# Patient Record
Sex: Female | Born: 1955 | Race: White | Hispanic: No | Smoking: Never smoker
Health system: Southern US, Community
[De-identification: ages and names within clinical notes are randomized; demographics above are authoritative.]

## PROBLEM LIST (undated history)

## (undated) DIAGNOSIS — K219 Gastro-esophageal reflux disease without esophagitis: Secondary | ICD-10-CM

## (undated) DIAGNOSIS — T7840XA Allergy, unspecified, initial encounter: Secondary | ICD-10-CM

## (undated) DIAGNOSIS — I10 Essential (primary) hypertension: Secondary | ICD-10-CM

## (undated) DIAGNOSIS — F909 Attention-deficit hyperactivity disorder, unspecified type: Secondary | ICD-10-CM

## (undated) HISTORY — PX: KNEE SURGERY: SHX244

## (undated) HISTORY — DX: Essential (primary) hypertension: I10

## (undated) HISTORY — DX: Allergy, unspecified, initial encounter: T78.40XA

## (undated) HISTORY — DX: Gastro-esophageal reflux disease without esophagitis: K21.9

## (undated) HISTORY — PX: CARPAL TUNNEL RELEASE: SHX101

## (undated) HISTORY — PX: ABDOMINAL HYSTERECTOMY: SHX81

## (undated) HISTORY — PX: PLANTAR FASCIA SURGERY: SHX746

---

## 2003-02-17 ENCOUNTER — Encounter: Payer: Self-pay | Admitting: Family Medicine

## 2005-12-20 ENCOUNTER — Ambulatory Visit: Payer: Self-pay | Admitting: Family Medicine

## 2006-02-26 ENCOUNTER — Ambulatory Visit: Payer: Self-pay | Admitting: General Practice

## 2006-07-09 ENCOUNTER — Emergency Department: Payer: Self-pay | Admitting: Emergency Medicine

## 2007-06-03 ENCOUNTER — Encounter (INDEPENDENT_AMBULATORY_CARE_PROVIDER_SITE_OTHER): Payer: Self-pay | Admitting: Obstetrics and Gynecology

## 2007-06-03 ENCOUNTER — Ambulatory Visit (HOSPITAL_COMMUNITY): Admission: RE | Admit: 2007-06-03 | Discharge: 2007-06-04 | Payer: Self-pay | Admitting: Obstetrics and Gynecology

## 2008-03-24 ENCOUNTER — Encounter: Payer: Self-pay | Admitting: Family Medicine

## 2008-06-05 ENCOUNTER — Ambulatory Visit: Payer: Self-pay

## 2008-07-10 ENCOUNTER — Emergency Department: Payer: Self-pay | Admitting: Emergency Medicine

## 2008-11-15 ENCOUNTER — Ambulatory Visit (HOSPITAL_BASED_OUTPATIENT_CLINIC_OR_DEPARTMENT_OTHER): Admission: RE | Admit: 2008-11-15 | Discharge: 2008-11-15 | Payer: Self-pay | Admitting: Orthopedic Surgery

## 2008-12-06 ENCOUNTER — Ambulatory Visit: Payer: Self-pay

## 2009-02-23 ENCOUNTER — Encounter: Payer: Self-pay | Admitting: Family Medicine

## 2009-06-04 ENCOUNTER — Emergency Department: Payer: Self-pay | Admitting: Emergency Medicine

## 2009-08-04 LAB — CONVERTED CEMR LAB

## 2009-11-08 ENCOUNTER — Ambulatory Visit: Payer: Self-pay | Admitting: Family Medicine

## 2009-11-08 ENCOUNTER — Encounter (INDEPENDENT_AMBULATORY_CARE_PROVIDER_SITE_OTHER): Payer: Self-pay | Admitting: *Deleted

## 2009-11-08 DIAGNOSIS — I10 Essential (primary) hypertension: Secondary | ICD-10-CM

## 2009-11-08 DIAGNOSIS — K219 Gastro-esophageal reflux disease without esophagitis: Secondary | ICD-10-CM | POA: Insufficient documentation

## 2009-12-01 ENCOUNTER — Ambulatory Visit: Payer: Self-pay | Admitting: Family Medicine

## 2009-12-28 ENCOUNTER — Ambulatory Visit: Payer: Self-pay | Admitting: Family Medicine

## 2009-12-30 ENCOUNTER — Telehealth: Payer: Self-pay | Admitting: Family Medicine

## 2010-01-13 ENCOUNTER — Ambulatory Visit: Payer: Self-pay | Admitting: Family Medicine

## 2010-02-15 ENCOUNTER — Telehealth: Payer: Self-pay | Admitting: Family Medicine

## 2010-03-10 ENCOUNTER — Ambulatory Visit: Payer: Self-pay | Admitting: Family Medicine

## 2010-05-18 ENCOUNTER — Telehealth: Payer: Self-pay | Admitting: Family Medicine

## 2010-06-06 ENCOUNTER — Ambulatory Visit: Payer: Self-pay | Admitting: Family Medicine

## 2010-06-06 DIAGNOSIS — L259 Unspecified contact dermatitis, unspecified cause: Secondary | ICD-10-CM | POA: Insufficient documentation

## 2010-06-13 ENCOUNTER — Ambulatory Visit: Payer: Self-pay | Admitting: Internal Medicine

## 2010-06-29 ENCOUNTER — Ambulatory Visit: Payer: Self-pay | Admitting: Family Medicine

## 2010-06-29 DIAGNOSIS — H60339 Swimmer's ear, unspecified ear: Secondary | ICD-10-CM | POA: Insufficient documentation

## 2010-07-19 ENCOUNTER — Ambulatory Visit: Payer: Self-pay | Admitting: Family Medicine

## 2010-07-19 DIAGNOSIS — M7710 Lateral epicondylitis, unspecified elbow: Secondary | ICD-10-CM | POA: Insufficient documentation

## 2010-07-20 ENCOUNTER — Telehealth: Payer: Self-pay | Admitting: Family Medicine

## 2010-07-21 ENCOUNTER — Encounter: Payer: Self-pay | Admitting: Family Medicine

## 2010-08-14 ENCOUNTER — Telehealth: Payer: Self-pay | Admitting: Family Medicine

## 2010-11-23 ENCOUNTER — Ambulatory Visit: Payer: Self-pay | Admitting: Family Medicine

## 2010-11-28 ENCOUNTER — Ambulatory Visit
Admission: RE | Admit: 2010-11-28 | Discharge: 2010-11-28 | Payer: Self-pay | Source: Home / Self Care | Attending: Internal Medicine | Admitting: Internal Medicine

## 2010-12-01 ENCOUNTER — Telehealth: Payer: Self-pay | Admitting: Family Medicine

## 2010-12-12 ENCOUNTER — Encounter: Payer: Self-pay | Admitting: Family Medicine

## 2010-12-12 ENCOUNTER — Ambulatory Visit
Admission: RE | Admit: 2010-12-12 | Discharge: 2010-12-12 | Payer: Self-pay | Source: Home / Self Care | Attending: Psychology | Admitting: Psychology

## 2010-12-14 ENCOUNTER — Ambulatory Visit
Admission: RE | Admit: 2010-12-14 | Discharge: 2010-12-14 | Payer: Self-pay | Source: Home / Self Care | Attending: Psychology | Admitting: Psychology

## 2010-12-27 ENCOUNTER — Ambulatory Visit: Payer: Self-pay | Admitting: Psychology

## 2010-12-27 NOTE — Assessment & Plan Note (Signed)
Summary: LEFT ELBOW PAIN   Vital Signs:  Patient profile:   55 year old female Height:      60 inches Weight:      204.50 pounds BMI:     40.08 Temp:     98.3 degrees F oral Pulse rate:   72 / minute Pulse rhythm:   regular BP sitting:   120 / 82  (right arm) Cuff size:   large  Vitals Entered By: Linde Gillis CMA [facility] Dull) (July 19, 2010 8:23 AM) CC: left elbow pain   History of Present Illness: 55 yo with 1 week of left elbow pain.  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  Next day, left elbow hurt only when she lifts something.  Strength is normal.  No LUE weakness or tingling.  On Diclofen per ortho for her plantar fascitis which is helping.  Current Medications (verified): 1)  Diclofenac Sodium 75 Mg Tbec (Diclofenac Sodium) .... Take 1 Tablet By Mouth Two Times A Day 2)  Vivelle-Dot 0.1 Mg/24hr Pttw (Estradiol) .... Change Patch Every 3-1/2 Days 3)  Zyrtec Allergy 10 Mg Caps (Cetirizine Hcl) .... Take 1 Tablet By Mouth Once A Day 4)  Flonase 50 Mcg/act Susp (Fluticasone Propionate) .... 2 Puffs Daily in Each Nostril. 5)  Lisinopril 20 Mg Tabs (Lisinopril) .Marland Kitchen.. 1 Tablet By Mouth Daily 6)  Zantac 150 Mg Tabs (Ranitidine Hcl) .Marland Kitchen.. 1 Tab By Mouth Two Times A Day As Needed Reflux  Allergies: 1)  ! Sulfa 2)  ! Mobic (Meloxicam)  Past History:  Past Medical History: Last updated: 11/08/2009 Hypertension GERD  Past Surgical History: Last updated: 11/08/2009 Hysterectomy  Family History: Last updated: 11/08/2009 Mom- alive, cervical CA at 37 Dad - alives, 18, had stroke in 1998  Social History: Last updated: 11/08/2009 Lives with husband and 3 children (16, 86, 52) in [place].  Never Smoked Alcohol use-no Drug use-no Works as a Arboriculturist for [employer].  Risk Factors: Smoking Status: never (11/08/2009)  Review of Systems      See HPI MS:  Denies joint  redness, loss of strength, and muscle weakness.  Physical Exam  General:  alert.  NAD Msk:  Localized tenderness over the left lateral epicondyle and proximal wrist extensor muscle mass  Pain with resisted wrist extension with the elbow in full extension No erythema. Neurologic:  alert & oriented X3, strength normal in all extremities, and gait normal.   Psych:  talkative, pleasant, good eye contact.   Impression & Recommendations:  Problem # 1:  LATERAL EPICONDYLITIS, LEFT (ICD-726.32) Assessment New Given note for work so she will not continue with repetitive motions or heavy lifting. Continue Diclofenac. If no improvement, discussed other options, such as PT, injections.  Complete Medication List: 1)  Diclofenac Sodium 75 Mg Tbec (Diclofenac sodium) .... Take 1 tablet by mouth two times a day 2)  Vivelle-dot 0.1 Mg/24hr Pttw (Estradiol) .... Change patch every 3-1/2 days 3)  Zyrtec Allergy 10 Mg Caps (Cetirizine hcl) .... Take 1 tablet by mouth once a day 4)  Flonase 50 Mcg/act Susp (Fluticasone propionate) .... 2 puffs daily in each nostril. 5)  Lisinopril 20 Mg Tabs (Lisinopril) .Marland Kitchen.. 1 tablet by mouth daily 6)  Zantac 150 Mg Tabs (Ranitidine hcl) .Marland Kitchen.. 1 tab by mouth two times a day as needed reflux  Current Allergies (reviewed today): ! SULFA ! MOBIC (MELOXICAM)

## 2010-12-27 NOTE — Assessment & Plan Note (Signed)
Summary: EARS,THROAT,CONGESTION/CLE   Vital Signs:  Patient profile:   55 year old female Height:      60 inches Weight:      200.38 pounds BMI:     39.28 Temp:     97.9 degrees F oral Pulse rate:   84 / minute Pulse rhythm:   regular BP sitting:   122 / 76  (left arm) Cuff size:   large  Vitals Entered By: Delilah Shan CMA [facility] Dull) (January 13, 2010 12:37 PM) CC: Ears, throat, congestion   History of Present Illness: 55 yo with persistent congestion, ears popping sore throat. Seen over one month ago, treated with Zpack for sinus infection, then again with Augmentin 2 weeks ago.  Symptoms improved then started getting runny nose, ears popping and sore throat yesterday morning.   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 Cough is gone. No fevers, chills, wheezing, or shortness of breath. No n/v/d.     Current Medications (verified): 1)  Lisinopril-Hydrochlorothiazide 20-12.5 Mg Tabs (Lisinopril-Hydrochlorothiazide) .... Take 1 Tablet By Mouth Once A Day 2)  Diclofenac Sodium 75 Mg Tbec (Diclofenac Sodium) .... Take 1 Tablet By Mouth Two Times A Day 3)  Vivelle-Dot 0.1 Mg/24hr Pttw (Estradiol) .... Change Patch Every 3-1/2 Days 4)  Terbinafine Hcl 250 Mg Tabs (Terbinafine Hcl) .... Take 1 Tablet By Mouth Once A Day 5)  Zyrtec Allergy 10 Mg Caps (Cetirizine Hcl) .... Take 1 Tablet By Mouth Once A Day 6)  Flonase 50 Mcg/act Susp (Fluticasone Propionate) .... 2 Puffs Daily in Each Nostril.  Allergies: 1)  ! Sulfa 2)  ! Mobic (Meloxicam)  Review of Systems      See HPI ENT:  Complains of earache and postnasal drainage; denies ear discharge. CV:  Denies chest pain or discomfort. Resp:  Complains of cough.  Physical Exam  General:  overweight, pleasant, NAD. Vs reviewed- afebrile and normotensive Ears:  TMs retracted bilaterally. Nose:  mild erythema Mouth:  pharyngeal erythema.  pharyngeal erythema.   Lungs:  Normal respiratory effort,  chest expands symmetrically. Lungs are clear to auscultation, no crackles or wheezes. Heart:  Normal rate and regular rhythm. S1 and S2 normal without gallop, murmur, click, rub or other extra sounds. Extremities:  no edema Skin:  Intact without suspicious lesions or rashes Psych:  talkative, pleasant, good eye contact.   Impression & Recommendations:  Problem # 1:  URI (ICD-465.9) Assessment New  early in course, likely viral.  REcent abx. Works in school so is exposed to different viruses this time of year. Continue supportive care and f/u if no improvement in 7-10 days. The following medications were removed from the medication list:    Cheratussin Ac 100-10 Mg/56ml Syrp (Guaifenesin-codeine) .Marland KitchenMarland KitchenMarland KitchenMarland Kitchen 5 ml two times a day as needed cough Her updated medication list for this problem includes:    Diclofenac Sodium 75 Mg Tbec (Diclofenac sodium) .Marland Kitchen... Take 1 tablet by mouth two times a day    Zyrtec Allergy 10 Mg Caps (Cetirizine hcl) .Marland Kitchen... Take 1 tablet by mouth once a day  The following medications were removed from the medication list:    Cheratussin Ac 100-10 Mg/22ml Syrp (Guaifenesin-codeine) .Marland KitchenMarland KitchenMarland KitchenMarland Kitchen 5 ml two times a day as needed cough Her updated medication list for this problem includes:    Diclofenac Sodium 75 Mg Tbec (Diclofenac sodium) .Marland Kitchen... Take 1 tablet by mouth two times a day    Zyrtec Allergy 10 Mg Caps (Cetirizine hcl) .Marland Kitchen... Take  1 tablet by mouth once a day  Complete Medication List: 1)  Lisinopril-hydrochlorothiazide 20-12.5 Mg Tabs (Lisinopril-hydrochlorothiazide) .... Take 1 tablet by mouth once a day 2)  Diclofenac Sodium 75 Mg Tbec (Diclofenac sodium) .... Take 1 tablet by mouth two times a day 3)  Vivelle-dot 0.1 Mg/24hr Pttw (Estradiol) .... Change patch every 3-1/2 days 4)  Terbinafine Hcl 250 Mg Tabs (Terbinafine hcl) .... Take 1 tablet by mouth once a day 5)  Zyrtec Allergy 10 Mg Caps (Cetirizine hcl) .... Take 1 tablet by mouth once a day 6)  Flonase 50 Mcg/act  Susp (Fluticasone propionate) .... 2 puffs daily in each nostril.  Current Allergies (reviewed today): ! SULFA ! MOBIC (MELOXICAM)

## 2010-12-27 NOTE — Progress Notes (Signed)
Summary: ?yeast infection  Phone Note Call from Patient Call back at [phone]   Caller: Patient Call For: Ruthe Mannan MD Summary of Call: Saw Dr. Dayton Martes on 12/28/09 and given Augmentin. today began with vaginal and perineal itching, no discharge. Pt thinks has yeast infection due to antibiotic. Pt [facility]. Please advise.  Initial call taken by: Lewanda Rife LPN,  December 30, 2009 4:17 PM    New/Updated Medications: DIFLUCAN 150 MG TABS (FLUCONAZOLE) once daily Prescriptions: DIFLUCAN 150 MG TABS (FLUCONAZOLE) once daily  #1 x 0   Entered and Authorized by:   Ruthe Mannan MD   Signed by:   Ruthe Mannan MD on 12/30/2009   Method used:   [facility]        [facility]  [address] [phone][facility] (retail[facility]       [address]       [place], [place]  [address]       Ph: [phone] or [phone]       Fax: [phone]   RxID:   [id number]

## 2010-12-27 NOTE — Assessment & Plan Note (Signed)
Summary: BLOOD PRESSURE HAS BEEN LOW   Vital Signs:  Patient profile:   55 year old female Height:      60 inches Weight:      200.38 pounds BMI:     39.28 Temp:     98.7 degrees F oral Pulse rate:   80 / minute Pulse rhythm:   regular BP sitting:   100 / 72  (left arm) Cuff size:   large  Vitals Entered By: Linde Gillis CMA [facility] Dull) (March 10, 2010 9:45 AM) CC: low blood pressure   History of Present Illness: 55 yo here for low blood pressure.  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  School nurse checked BP and it was 88/66.  Same thing happened again the next day and BP was 89/78.  Has been on Lisinopril/HCTZ 20-12.5 mg for years.   No CP, SOB, HA or focal neurological deficits.  No sycope or presyncope.  Did take her meds this morning, BP 100/72 in office today.  GERD- has had reflux for years.  Previously been taking Prilosec OTC.  Helps some what, but she still has symptoms at night.  No abdominal pain, nausea, vomiting or diarrhea.  Current Medications (verified): 1)  Diclofenac Sodium 75 Mg Tbec (Diclofenac Sodium) .... Take 1 Tablet By Mouth Two Times A Day 2)  Vivelle-Dot 0.1 Mg/24hr Pttw (Estradiol) .... Change Patch Every 3-1/2 Days 3)  Zyrtec Allergy 10 Mg Caps (Cetirizine Hcl) .... Take 1 Tablet By Mouth Once A Day 4)  Flonase 50 Mcg/act Susp (Fluticasone Propionate) .... 2 Puffs Daily in Each Nostril. 5)  Lisinopril 20 Mg Tabs (Lisinopril) .Marland Kitchen.. 1 Tablet By Mouth Daily 6)  Zantac 150 Mg Tabs (Ranitidine Hcl) .Marland Kitchen.. 1 Tab By Mouth Two Times A Day As Needed Reflux  Allergies: 1)  ! Sulfa 2)  ! Mobic (Meloxicam)  Review of Systems      See HPI General:  Denies chills, fatigue, and fever. Eyes:  Denies blurring. CV:  Denies chest pain or discomfort. Resp:  Denies shortness of breath. Neuro:  Denies falling down, headaches, inability to speak, memory loss, numbness, sensation of room spinning, tingling,  tremors, visual disturbances, and weakness. Psych:  Denies anxiety and depression.  Physical Exam  General:  overweight, pleasant, NAD.  Mouth:  MMM Lungs:  Normal respiratory effort, chest expands symmetrically. Lungs are clear to auscultation, no crackles or wheezes. Heart:  Normal rate and regular rhythm. S1 and S2 normal without gallop, murmur, click, rub or other extra sounds. Extremities:  no edema Psych:  talkative, pleasant, good eye contact.   Impression & Recommendations:  Problem # 1:  HYPERTENSION (ICD-401.9) Assessment Improved becoming HYPOtensive.  Will d/c combo pill and start Lisinopril 20 mg daily only.  Advised to keep BP log at school.  See pt instructions for details. The following medications were removed from the medication list:    Lisinopril-hydrochlorothiazide 20-12.5 Mg Tabs (Lisinopril-hydrochlorothiazide) .Marland Kitchen... Take 1 tablet by mouth once a day Her updated medication list for this problem includes:    Lisinopril 20 Mg Tabs (Lisinopril) .Marland Kitchen... 1 tablet by mouth daily  Problem # 2:  GERD (ICD-530.81) Assessment: Deteriorated Discussed risks of long term use of PPIs, will try Zantac.  Pt in agreement with plan. Her updated medication list for this problem includes:    Zantac 150 Mg Tabs (Ranitidine hcl) .Marland Kitchen... 1 tab by mouth two times a day as needed reflux  Complete  Medication List: 1)  Diclofenac Sodium 75 Mg Tbec (Diclofenac sodium) .... Take 1 tablet by mouth two times a day 2)  Vivelle-dot 0.1 Mg/24hr Pttw (Estradiol) .... Change patch every 3-1/2 days 3)  Zyrtec Allergy 10 Mg Caps (Cetirizine hcl) .... Take 1 tablet by mouth once a day 4)  Flonase 50 Mcg/act Susp (Fluticasone propionate) .... 2 puffs daily in each nostril. 5)  Lisinopril 20 Mg Tabs (Lisinopril) .Marland Kitchen.. 1 tablet by mouth daily 6)  Zantac 150 Mg Tabs (Ranitidine hcl) .Marland Kitchen.. 1 tab by mouth two times a day as needed reflux  Patient Instructions: 1)  Stop taking Lisinopril-HCTZ. 2)  Start  taking Lisinopril 20 mg daily. 3)  Have school nurse check blood pressure next few days, then call me next week. Prescriptions: ZANTAC 150 MG TABS (RANITIDINE HCL) 1 tab by mouth two times a day as needed reflux  #60 x 3   Entered and Authorized by:   Ruthe Mannan MD   Signed by:   Ruthe Mannan MD on 03/10/2010   Method used:   [facility]        [facility]  [address] [phone][facility] (retail)       [address]       [place], [place]  [address]       Ph: [phone] or [phone]       Fax: [phone]   RxID:   [id number] LISINOPRIL 20 MG TABS (LISINOPRIL) 1 tablet by mouth daily  #90 x 3   Entered and Authorized by:   Ruthe Mannan MD   Signed by:   Ruthe Mannan MD on 03/10/2010   Method used:   [facility]        [facility]  [address] [phone].* (retail)       [address]       [place], [place]  [address]       Ph: [phone] or [phone]       Fax: [phone]   RxID:   [id number]   Current Allergies (reviewed today): ! SULFA ! MOBIC (MELOXICAM)

## 2010-12-27 NOTE — Miscellaneous (Signed)
Summary: Orders Update  Clinical Lists Changes  Orders: Added new Service order of Tennis Elbow Support [phone]) - Signed

## 2010-12-27 NOTE — Assessment & Plan Note (Signed)
Summary: EARS HURT   Vital Signs:  Patient profile:   55 year old female Height:      60 inches Weight:      201 pounds BMI:     39.40 Temp:     98.1 degrees F oral Pulse rate:   88 / minute Pulse rhythm:   regular BP sitting:   124 / 84  (left arm) Cuff size:   large  Vitals Entered By: Delilah Shan CMA [facility] Dull) (December 01, 2009 3:49 PM) CC: 1.  Ears hurt.  2.  Rx. Flonase   History of Present Illness: 7 day h/o nasal congestion, bilateral ear pressure, frontal headache. Chills, no fevers. No sore throat. No cough, no wheezing, no shortness of breath. Taking Flonase, needs refills.  Current Medications (verified): 1)  Lisinopril-Hydrochlorothiazide 20-12.5 Mg Tabs (Lisinopril-Hydrochlorothiazide) .... Take 1 Tablet By Mouth Once A Day 2)  Diclofenac Sodium 75 Mg Tbec (Diclofenac Sodium) .... Take 1 Tablet By Mouth Two Times A Day 3)  Vivelle-Dot 0.1 Mg/24hr Pttw (Estradiol) .... Change Patch Every 3-1/2 Days 4)  Terbinafine Hcl 250 Mg Tabs (Terbinafine Hcl) .... Take 1 Tablet By Mouth Once A Day 5)  Zyrtec Allergy 10 Mg Caps (Cetirizine Hcl) .... Take 1 Tablet By Mouth Once A Day 6)  Azithromycin 250 Mg  Tabs (Azithromycin) .... 2 By  Mouth Today and Then 1 Daily For 4 Days 7)  Flonase 50 Mcg/act Susp (Fluticasone Propionate) .... 2 Puffs Daily in Each Nostril.  Allergies: 1)  ! Sulfa 2)  ! Mobic (Meloxicam)  Review of Systems      See HPI General:  Complains of chills; denies fever. ENT:  Complains of nasal congestion and postnasal drainage; denies earache. Resp:  Denies cough, shortness of breath, sputum productive, and wheezing.  Physical Exam  General:  overweight, pleasant, NAD. Ears:  bilateral clear fluid Nose:  nasal dischargemucosal pallor.   sinuses + Mouth:  Oral mucosa and oropharynx without lesions or exudates.  Teeth in good repair. Lungs:  Normal respiratory effort, chest expands symmetrically. Lungs are clear to auscultation, no crackles or  wheezes. Heart:  Normal rate and regular rhythm. S1 and S2 normal without gallop, murmur, click, rub or other extra sounds. Psych:  talkative, pleasant, good eye contact.   Impression & Recommendations:  Problem # 1:  OTHER ACUTE SINUSITIS (ICD-461.8) Assessment New Zpack.  Conitnue supportive care with Flonase and Mucinex.  RTC if no improvement in 5-7 days. Her updated medication list for this problem includes:    Azithromycin 250 Mg Tabs (Azithromycin) .Marland Kitchen... 2 by  mouth today and then 1 daily for 4 days    Flonase 50 Mcg/act Susp (Fluticasone propionate) .Marland Kitchen... 2 puffs daily in each nostril.  Complete Medication List: 1)  Lisinopril-hydrochlorothiazide 20-12.5 Mg Tabs (Lisinopril-hydrochlorothiazide) .... Take 1 tablet by mouth once a day 2)  Diclofenac Sodium 75 Mg Tbec (Diclofenac sodium) .... Take 1 tablet by mouth two times a day 3)  Vivelle-dot 0.1 Mg/24hr Pttw (Estradiol) .... Change patch every 3-1/2 days 4)  Terbinafine Hcl 250 Mg Tabs (Terbinafine hcl) .... Take 1 tablet by mouth once a day 5)  Zyrtec Allergy 10 Mg Caps (Cetirizine hcl) .... Take 1 tablet by mouth once a day 6)  Azithromycin 250 Mg Tabs (Azithromycin) .... 2 by  mouth today and then 1 daily for 4 days 7)  Flonase 50 Mcg/act Susp (Fluticasone propionate) .... 2 puffs daily in each nostril. Prescriptions: FLONASE 50 MCG/ACT SUSP (FLUTICASONE PROPIONATE) 2 puffs  daily in each nostril.  #1 x 11   Entered and Authorized by:   Ruthe Mannan MD   Signed by:   Ruthe Mannan MD on 12/01/2009   Method used:   [facility]        [facility]  [address] [phone].* (retail)       [address]       [place], [place]  [address]       Ph: [phone] or [phone]       Fax: [phone]   RxID:   [id number] AZITHROMYCIN 250 MG  TABS (AZITHROMYCIN) 2 by  mouth today and then 1 daily for 4 days  #6 x 0   Entered and Authorized by:   Ruthe Mannan MD   Signed by:   Ruthe Mannan MD on 12/01/2009   Method used:    [facility]        [facility]  [address] [phone].* (retail)       [address]       [place], [place]  [address]       Ph: [phone] or [phone]       Fax: [phone]   RxID:   [id number]   Current Allergies (reviewed today): ! SULFA ! MOBIC (MELOXICAM)

## 2010-12-27 NOTE — Assessment & Plan Note (Signed)
Summary: EAR/CLE   Vital Signs:  Patient profile:   55 year old female Height:      60 inches Weight:      201.25 pounds BMI:     39.45 Temp:     98.3 degrees F oral Pulse rate:   68 / minute Pulse rhythm:   regular BP sitting:   122 / 80  (left arm) Cuff size:   large  Vitals Entered By: Linde Gillis CMA Duncan Dull) (June 29, 2010 10:44 AM) CC: left ear pain   History of Present Illness: 55 yo here for left ear pain that started a few days ago, getting progressively worse. Now outside of ear hurts to touch and laying on pillow. No drainage, no fevers or chills. Has been swimming a lot this summer. No difficulty hearing.  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  Current Medications (verified): 1)  Diclofenac Sodium 75 Mg Tbec (Diclofenac Sodium) .... Take 1 Tablet By Mouth Two Times A Day 2)  Vivelle-Dot 0.1 Mg/24hr Pttw (Estradiol) .... Change Patch Every 3-1/2 Days 3)  Zyrtec Allergy 10 Mg Caps (Cetirizine Hcl) .... Take 1 Tablet By Mouth Once A Day 4)  Flonase 50 Mcg/act Susp (Fluticasone Propionate) .... 2 Puffs Daily in Each Nostril. 5)  Lisinopril 20 Mg Tabs (Lisinopril) .Marland Kitchen.. 1 Tablet By Mouth Daily 6)  Zantac 150 Mg Tabs (Ranitidine Hcl) .Marland Kitchen.. 1 Tab By Mouth Two Times A Day As Needed Reflux 7)  Ciprodex 0.3-0.1 % Susp (Ciprofloxacin-Dexamethasone) .... Four Drops Into Affected Ear Two Times A Day X 7 Days  Allergies: 1)  ! Sulfa 2)  ! Mobic (Meloxicam)  Past History:  Past Medical History: Last updated: 11/08/2009 Hypertension GERD  Past Surgical History: Last updated: 11/08/2009 Hysterectomy  Family History: Last updated: 11/08/2009 Mom- alive, cervical CA at 36 Dad - alives, 77, had stroke in 1998  Social History: Last updated: 11/08/2009 Lives with husband and 3 children (16, 23, 60) in [place].  Never Smoked Alcohol use-no Drug use-no Works as a Arboriculturist for [employer].  Risk Factors: Smoking Status: never (11/08/2009)  Review of  Systems      See HPI General:  Denies fever. ENT:  Complains of earache; denies decreased hearing, ear discharge, nasal congestion, postnasal drainage, ringing in ears, sinus pressure, and sore throat. Resp:  Denies cough.  Physical Exam  General:  alert.  NAD Ears:  R ear normal, L tragus tender, and L canal inflamed.   Mouth:  no exudates.  Mild pharyngeal injection  Lungs:  normal respiratory effort, no intercostal retractions, no accessory muscle use, and normal breath sounds.   Heart:  Normal rate and regular rhythm. S1 and S2 normal without gallop, murmur, click, rub or other extra sounds. Psych:  talkative, pleasant, good eye contact.   Impression & Recommendations:  Problem # 1:  ACUTE SWIMMERS EAR (ICD-380.12) Assessment New Ciprodex x 7 days.  Swimming precautions given. Her updated medication list for this problem includes:    Ciprodex 0.3-0.1 % Susp (Ciprofloxacin-dexamethasone) .Marland Kitchen... Four drops into affected ear two times a day x 7 days  Complete Medication List: 1)  Diclofenac Sodium 75 Mg Tbec (Diclofenac sodium) .... Take 1 tablet by mouth two times a day 2)  Vivelle-dot 0.1 Mg/24hr Pttw (Estradiol) .... Change patch every 3-1/2 days 3)  Zyrtec Allergy 10 Mg Caps (Cetirizine hcl) .... Take 1 tablet by mouth once a day 4)  Flonase 50 Mcg/act Susp (Fluticasone propionate) .... 2 puffs daily in each nostril. 5)  Lisinopril 20 Mg Tabs (Lisinopril) .Marland Kitchen.. 1 tablet by mouth daily 6)  Zantac 150 Mg Tabs (Ranitidine hcl) .Marland Kitchen.. 1 tab by mouth two times a day as needed reflux 7)  Ciprodex 0.3-0.1 % Susp (Ciprofloxacin-dexamethasone) .... Four drops into affected ear two times a day x 7 days Prescriptions: CIPRODEX 0.3-0.1 % SUSP (CIPROFLOXACIN-DEXAMETHASONE) four drops into affected ear two times a day x 7 days  #1 x 0   Entered and Authorized by:   Ruthe Mannan MD   Signed by:   Ruthe Mannan MD on 06/29/2010   Method used:   [facility]        [facility]  [address] [phone][facility]  (retail)       [address]       [place], [place]  [address]       Ph: [phone] or [phone]       Fax: [phone]   RxID:   [id number]   Current Allergies (reviewed today): ! SULFA ! MOBIC (MELOXICAM)

## 2010-12-27 NOTE — Assessment & Plan Note (Signed)
Summary: F/U EAR INFECTION/CLE   Vital Signs:  Patient profile:   55 year old female Height:      60 inches Weight:      198.38 pounds BMI:     38.88 Temp:     98.2 degrees F oral Pulse rate:   84 / minute Pulse rhythm:   regular BP sitting:   110 / 70  (left arm) Cuff size:   large  Vitals Entered By: Delilah Shan CMA Duncan Dull) (December 28, 2009 3:46 PM) CC: F/U Ear infection.  Right ear never stopped hurting, now left ear hurts and skin hurts.   History of Present Illness: 55 yo seen one month ago for sinus infection, treated with Zpack. Symptoms improved but started coming back last week. BIlateral ear pain, pressure, nasal congestion, sinus pressure, chills, subjective fever. 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  Has dry, hacking cough, taking Delsym which is not helping with cough. Coughing spells at night, can't sleep. Taking Mucinex OTC as well.  Current Medications (verified): 1)  Lisinopril-Hydrochlorothiazide 20-12.5 Mg Tabs (Lisinopril-Hydrochlorothiazide) .... Take 1 Tablet By Mouth Once A Day 2)  Diclofenac Sodium 75 Mg Tbec (Diclofenac Sodium) .... Take 1 Tablet By Mouth Two Times A Day 3)  Vivelle-Dot 0.1 Mg/24hr Pttw (Estradiol) .... Change Patch Every 3-1/2 Days 4)  Terbinafine Hcl 250 Mg Tabs (Terbinafine Hcl) .... Take 1 Tablet By Mouth Once A Day 5)  Zyrtec Allergy 10 Mg Caps (Cetirizine Hcl) .... Take 1 Tablet By Mouth Once A Day 6)  Flonase 50 Mcg/act Susp (Fluticasone Propionate) .... 2 Puffs Daily in Each Nostril. 7)  Augmentin 875-125 Mg Tabs (Amoxicillin-Pot Clavulanate) .Marland Kitchen.. 1 By Mouth 2 Times Daily X 10 Days 8)  Cheratussin Ac 100-10 Mg/3ml Syrp (Guaifenesin-Codeine) .... 5 Ml Two Times A Day As Needed Cough  Allergies: 1)  ! Sulfa 2)  ! Mobic (Meloxicam)  Review of Systems      See HPI General:  Complains of chills, fever, and malaise. ENT:  Complains of earache, nasal congestion, postnasal drainage, and  sinus pressure; denies ear discharge and sore throat. CV:  Denies chest pain or discomfort. Resp:  Complains of cough; denies shortness of breath, sputum productive, and wheezing. GI:  Denies abdominal pain, diarrhea, nausea, and vomiting.  Physical Exam  General:  overweight, pleasant, NAD. Vs reviewed- afebrile and normotensive Ears:  TMs retracted bilaterally. Nose:  nasal dischargemucosal pallor.   + sinuses Mouth:  Oral mucosa and oropharynx without lesions or exudates.  Teeth in good repair. Lungs:  Normal respiratory effort, chest expands symmetrically. Lungs are clear to auscultation, no crackles or wheezes. Heart:  Normal rate and regular rhythm. S1 and S2 normal without gallop, murmur, click, rub or other extra sounds. Extremities:  no edema Psych:  talkative, pleasant, good eye contact.   Impression & Recommendations:  Problem # 1:  OTHER ACUTE SINUSITIS (ICD-461.8) Assessment New Given duration of symptoms and recent treatment with Azithromycin, will treat with 10 day course of Augmentin. Continue supportive care. See patient instrucitons for details. Her updated medication list for this problem includes:    Flonase 50 Mcg/act Susp (Fluticasone propionate) .Marland Kitchen... 2 puffs daily in each nostril.    Augmentin 875-125 Mg Tabs (Amoxicillin-pot clavulanate) .Marland Kitchen... 1 by mouth 2 times daily x 10 days    Cheratussin Ac 100-10 Mg/30ml Syrp (Guaifenesin-codeine) .Marland KitchenMarland KitchenMarland KitchenMarland Kitchen 5 ml two times a day as needed cough  Complete Medication List: 1)  Lisinopril-hydrochlorothiazide 20-12.5 Mg Tabs (Lisinopril-hydrochlorothiazide) .... Take 1 tablet by mouth once a day 2)  Diclofenac Sodium 75 Mg Tbec (Diclofenac sodium) .... Take 1 tablet by mouth two times a day 3)  Vivelle-dot 0.1 Mg/24hr Pttw (Estradiol) .... Change patch every 3-1/2 days 4)  Terbinafine Hcl 250 Mg Tabs (Terbinafine hcl) .... Take 1 tablet by mouth once a day 5)  Zyrtec Allergy 10 Mg Caps (Cetirizine hcl) .... Take 1 tablet by  mouth once a day 6)  Flonase 50 Mcg/act Susp (Fluticasone propionate) .... 2 puffs daily in each nostril. 7)  Augmentin 875-125 Mg Tabs (Amoxicillin-pot clavulanate) .Marland Kitchen.. 1 by mouth 2 times daily x 10 days 8)  Cheratussin Ac 100-10 Mg/64ml Syrp (Guaifenesin-codeine) .... 5 ml two times a day as needed cough  Patient Instructions: 1)  Take antibiotic as directed.  Drink lots of fluids.  Treat sympotmatically with Mucinex, nasal saline irrigation, and Tylenol/Ibuprofen. You can also try using warm compresses.  Delsym as needed for cough during the day, Chertussin at night. Call if not improving as expected in 5-7 days.  Prescriptions: CHERATUSSIN AC 100-10 MG/5ML SYRP (GUAIFENESIN-CODEINE) 5 ml two times a day as needed cough  #4 ounces x 0   Entered and Authorized by:   Ruthe Mannan MD   Signed by:   Ruthe Mannan MD on 12/28/2009   Method used:   Print then Give to Patient   RxID:   [id number] AUGMENTIN 875-125 MG TABS (AMOXICILLIN-POT CLAVULANATE) 1 by mouth 2 times daily x 10 days  #20 x 0   Entered and Authorized by:   Ruthe Mannan MD   Signed by:   Ruthe Mannan MD on 12/28/2009   Method used:   [facility]        [facility]  [address] [address] [phone][facility]       [address]       [place], [place]  [address]       Ph: [phone] or [phone]       Fax: [phone]   RxID:   [id number]   Current Allergies (reviewed today): ! SULFA ! MOBIC (MELOXICAM)

## 2010-12-27 NOTE — Progress Notes (Signed)
Summary: ? Elbow strap  Phone Note Call from Patient Call back at Home Phone [phone]   Caller: Patient Call For: Ruthe Mannan MD Summary of Call: Patient called and would like an elbow strap that goes just below her elbow.  She says that a coworker uses one and it helps her a lot.  Patient advised that Dr. Dayton Martes is out of the office until tomorrow but we would respond to her message at that time. Initial call taken by: Linde Gillis CMA [facility] Dull),  July 20, 2010 10:08 AM  Follow-up for Phone Call        [facility], can you see if we have these in stock? Thanks, Windell Moulding MD  July 21, 2010 5:20 AM  We do have these in stock here at the office.  Is it ok for me to call patient and tell her she can come by and get it?  Linde Gillis CMA Duncan Dull)  July 21, 2010 8:14 AM   Additional Follow-up for Phone Call Additional follow up Details #1::        yes, thank you. Ruthe Mannan MD  July 21, 2010 10:30 AM  Patient advised as instructed via telephone.  She gets off work at 3:00pm and will come by then. Additional Follow-up by: Linde Gillis CMA [facility] Dull),  July 21, 2010 10:41 AM

## 2010-12-27 NOTE — Assessment & Plan Note (Signed)
Summary: SORE THROAT, EAR PAIN   Vital Signs:  Patient profile:   55 year old female Weight:      202.25 pounds Temp:     97.9 degrees F oral Pulse rate:   68 / minute Pulse rhythm:   regular Resp:     14 per minute BP sitting:   116 / 82  (left arm) Cuff size:   large  Vitals Entered By: Sydell Axon LPN (June 13, 2010 11:28 AM) CC: Right side of throat hurts and right ear pain   History of Present Illness: having right ear pain Pain in right side of throat also. Esp bad when she swallows Started yesterday but worsened today some vague ear symptoms  ~4 days ago but seemed to improve  No cough No sig drainage or rhinorrhea No fever NO SOB---other than from chronic bad sinuses (uses flonase and zyrtec)  Allergies: 1)  ! Sulfa 2)  ! Mobic (Meloxicam)  Past History:  Past medical, surgical, family and social histories (including risk factors) reviewed for relevance to current acute and chronic problems.  Past Medical History: Reviewed history from 11/08/2009 and no changes required. Hypertension GERD  Past Surgical History: Reviewed history from 11/08/2009 and no changes required. Hysterectomy  Family History: Reviewed history from 11/08/2009 and no changes required. Mom- alive, cervical CA at 40 Dad - alives, 99, had stroke in 1998  Social History: Reviewed history from 11/08/2009 and no changes required. Lives with husband and 3 children (16, 69, 12) in [place].  Never Smoked Alcohol use-no Drug use-no Works as a Arboriculturist for [employer].  Review of Systems       Mild nausea 3 or 4 days ago--this passed quickly appetite is okay No known strep exposure----nephew did have ear infection last week and she was watching him  Physical Exam  General:  alert.  NAD Head:  no maxillary or frontal tenderness Ears:  R ear normal and L ear normal.   No tragal tenderness Nose:  moderate congestion bilaterally Mouth:  no exudates.  Mild pharyngeal  injection  Neck:  supple, no masses, and no cervical lymphadenopathy.   Lungs:  normal respiratory effort, no intercostal retractions, no accessory muscle use, and normal breath sounds.     Impression & Recommendations:  Problem # 1:  PHARYNGITIS-ACUTE (ICD-462) Assessment New  clearly seems to be a viral infection discussed signs of secondary bacterial sinusitis--she will call if this occurs discussed supportive care  Orders: Rapid Strep ([address])  Complete Medication List: 1)  Diclofenac Sodium 75 Mg Tbec (Diclofenac sodium) .... Take 1 tablet by mouth two times a day 2)  Vivelle-dot 0.1 Mg/24hr Pttw (Estradiol) .... Change patch every 3-1/2 days 3)  Zyrtec Allergy 10 Mg Caps (Cetirizine hcl) .... Take 1 tablet by mouth once a day 4)  Flonase 50 Mcg/act Susp (Fluticasone propionate) .... 2 puffs daily in each nostril. 5)  Lisinopril 20 Mg Tabs (Lisinopril) .Marland Kitchen.. 1 tablet by mouth daily 6)  Zantac 150 Mg Tabs (Ranitidine hcl) .Marland Kitchen.. 1 tab by mouth two times a day as needed reflux 7)  Fluocinonide 0.05 % Crea (Fluocinonide) .... Apply to skin 2-4 times daily.  Patient Instructions: 1)  Please schedule a follow-up appointment as needed .  2)  Please try iburpofen or acetominophen for the discomfort  Current Allergies (reviewed today): ! SULFA ! MOBIC (MELOXICAM)   Laboratory Results    Other Tests  Rapid Strep: negative

## 2010-12-27 NOTE — Progress Notes (Signed)
Summary: Elbow pain no better  Phone Note Call from Patient Call back at Home Phone [phone]   Caller: Patient Call For: Ruthe Mannan MD Summary of Call: Patient wants to know how long does it usually take before her elbow starts to feel better?  She has been wearing the elbow strap since we gave it to her so time ago but now she is having sharp pains in her elbow and it isn't getting any better.  Please advise. Initial call taken by: Linde Gillis CMA [facility] Dull),  August 14, 2010 3:45 PM  Follow-up for Phone Call        we need to refer to ortho. please ask her if that is ok and I will place referral. Ruthe Mannan MD  August 14, 2010 3:51 PM  Patient agrees to see Orthopedic doctor.  She sees Dr. Delbert Harness, office number [phone].  Will wait to hear from [name] regarding referral.    Follow-up by: Linde Gillis CMA [facility] Dull),  August 14, 2010 4:39 PM

## 2010-12-27 NOTE — Miscellaneous (Signed)
Summary: Hep B record/[facility]  Hep B record/[facility]   Imported By: Lester Bowbells 06/08/2010 12:31:04  _____________________________________________________________________  External Attachment:    Type:   Image     Comment:   External Document

## 2010-12-27 NOTE — Progress Notes (Signed)
Summary: Not feeling any better  Phone Note Call from Patient Call back at Home Phone [phone]   Caller: Patient Call For: Ruthe Mannan MD Summary of Call: Patient was told to call back if her sinuses were not clearing up.  Having right ear pain, pain in sinus area, and is blowing out green mucous.  She is using Flonase and Zyrtec, not any better.  Says that if you call in an antibiotic please call in Diflucan for yeast infection also.  [facility] Initial call taken by: Linde Gillis CMA Duncan Dull),  February 15, 2010 3:19 PM    New/Updated Medications: AZITHROMYCIN 250 MG  TABS (AZITHROMYCIN) 2 by  mouth today and then 1 daily for 4 days DIFLUCAN 150 MG TABS (FLUCONAZOLE) once daily Prescriptions: DIFLUCAN 150 MG TABS (FLUCONAZOLE) once daily  #1 x 0   Entered and Authorized by:   Ruthe Mannan MD   Signed by:   Ruthe Mannan MD on 02/15/2010   Method used:   [facility]        [facility]  [address] [phone][facility] (retail[facility]       [address]       [place], [place]  [address]       Ph: [phone] or [phone]       Fax: [phone]   RxID:   [id number] AZITHROMYCIN 250 MG  TABS (AZITHROMYCIN) 2 by  mouth today and then 1 daily for 4 days  #6 x 0   Entered and Authorized by:   Ruthe Mannan MD   Signed by:   Ruthe Mannan MD on 02/15/2010   Method used:   [facility]        [facility]  [address] [phone][facility] (retail)       [address]       [place], [place]  [address]       Ph: [phone] or [phone]       Fax: [phone]   RxID:   [id number]   Appended Document: Not feeling any better Patient notified.

## 2010-12-27 NOTE — Progress Notes (Signed)
Summary: refill request for diflucan  Phone Note Refill Request Message from:  [employer]  Refills Requested: Medication #1:  fluconozole [facility] [address] [id number].  Initial call taken by: Lowella Petties CMA,  May 18, 2010 8:12 AM    New/Updated Medications: DIFLUCAN 150 MG TABS (FLUCONAZOLE) once daily Prescriptions: DIFLUCAN 150 MG TABS (FLUCONAZOLE) once daily  #1 x 0   Entered and Authorized by:   Ruthe Mannan MD   Signed by:   Ruthe Mannan MD on 05/18/2010   Method used:   [facility]        [facility]  [address] [phone][facility] (retail[facility]       [address]       [place], [place]  [address]       Ph: [phone] or [phone]       Fax: [phone]   RxID:   [id number]

## 2010-12-27 NOTE — Assessment & Plan Note (Signed)
Summary: RASH ON LEGS / LFW   Vital Signs:  Patient profile:   55 year old female Height:      60 inches Weight:      203.13 pounds BMI:     39.81 Temp:     98.5 degrees F oral Pulse rate:   68 / minute Pulse rhythm:   regular BP sitting:   120 / 80  (left arm) Cuff size:   large  Vitals Entered By: Linde Gillis CMA Duncan Dull) (June 06, 2010 11:36 AM) CC: rash on both legs near ankles   History of Present Illness: 55 yo here for itchy rash on her lower legs, bilaterally.   Was in and out of the house all day, but was not outside for a long period of time. Noticed the rash last night, not spreading. Rash does not hurt.  Had something similar when she slashed chemicals from work on her legs but she was not working yesterday when this started.  Has not changed any soaps or detergents.  No wheezing or SOB.  Current Medications (verified): 1)  Diclofenac Sodium 75 Mg Tbec (Diclofenac Sodium) .... Take 1 Tablet By Mouth Two Times A Day 2)  Vivelle-Dot 0.1 Mg/24hr Pttw (Estradiol) .... Change Patch Every 3-1/2 Days 3)  Zyrtec Allergy 10 Mg Caps (Cetirizine Hcl) .... Take 1 Tablet By Mouth Once A Day 4)  Flonase 50 Mcg/act Susp (Fluticasone Propionate) .... 2 Puffs Daily in Each Nostril. 5)  Lisinopril 20 Mg Tabs (Lisinopril) .Marland Kitchen.. 1 Tablet By Mouth Daily 6)  Zantac 150 Mg Tabs (Ranitidine Hcl) .Marland Kitchen.. 1 Tab By Mouth Two Times A Day As Needed Reflux 7)  Fluocinonide 0.05 % Crea (Fluocinonide) .... Apply To Skin 2-4 Times Daily.  Allergies: 1)  ! Sulfa 2)  ! Mobic (Meloxicam)  Past History:  Past Medical History: Last updated: 11/08/2009 Hypertension GERD  Past Surgical History: Last updated: 11/08/2009 Hysterectomy  Family History: Last updated: 11/08/2009 Mom- alive, cervical CA at 72 Dad - alives, 13, had stroke in 1998  Social History: Last updated: 11/08/2009 Lives with husband and 3 children (16, 55, 47) in [place].  Never Smoked Alcohol use-no Drug  use-no Works as a Arboriculturist for [employer].  Risk Factors: Smoking Status: never (11/08/2009)  Review of Systems      See HPI Resp:  Denies shortness of breath and wheezing.  Physical Exam  General:  overweight, pleasant, NAD.  Skin:  bilateral lower legs: confluent, erythematous papular rash, non tender to palp, not warm. Psych:  talkative, pleasant, good eye contact.   Impression & Recommendations:  Problem # 1:  DERMATITIS, ALLERGIC (ICD-692.9) Assessment New Does seem consistent with an allergic dermatitis. Will try Lidex to area 2-4 times daily, use as needed Benadryl. Follow up if rash worsens or does not improve over the next few days. Her updated medication list for this problem includes:    Zyrtec Allergy 10 Mg Caps (Cetirizine hcl) .Marland Kitchen... Take 1 tablet by mouth once a day    Fluocinonide 0.05 % Crea (Fluocinonide) .Marland Kitchen... Apply to skin 2-4 times daily.  Complete Medication List: 1)  Diclofenac Sodium 75 Mg Tbec (Diclofenac sodium) .... Take 1 tablet by mouth two times a day 2)  Vivelle-dot 0.1 Mg/24hr Pttw (Estradiol) .... Change patch every 3-1/2 days 3)  Zyrtec Allergy 10 Mg Caps (Cetirizine hcl) .... Take 1 tablet by mouth once a day 4)  Flonase 50 Mcg/act Susp (Fluticasone propionate) .... 2 puffs daily in each nostril. 5)  Lisinopril  20 Mg Tabs (Lisinopril) .Marland Kitchen.. 1 tablet by mouth daily 6)  Zantac 150 Mg Tabs (Ranitidine hcl) .Marland Kitchen.. 1 tab by mouth two times a day as needed reflux 7)  Fluocinonide 0.05 % Crea (Fluocinonide) .... Apply to skin 2-4 times daily. Prescriptions: FLUOCINONIDE 0.05 % CREA (FLUOCINONIDE) apply to skin 2-4 times daily.  #30 g x 0   Entered and Authorized by:   Ruthe Mannan MD   Signed by:   Ruthe Mannan MD on 06/06/2010   Method used:   [facility]        [facility]  [address] [address] [phone].* (retail)       [address]       [place], [place]  [address]       Ph: [phone] or [phone]       Fax: [phone]   RxID:    [id number]   Current Allergies (reviewed today): ! SULFA ! MOBIC (MELOXICAM)  TD Result Date:  09/10/2002 TD Result:  historical TD Next Due:  10 yr

## 2010-12-27 NOTE — Letter (Signed)
Summary: Out of Work  [facility]  [address] [place], [place] [address]   Phone: [phone]  Fax: [phone]    July 19, 2010   Employee:  Lucilla Lame    To Whom It May Concern:   For Medical reasons, please excuse the above named employee from lifting more than 20 pounds or using power machinery until further notice.   If you need additional information, please feel free to contact our office.         Sincerely,    Ruthe Mannan MD

## 2010-12-28 NOTE — Progress Notes (Signed)
Summary: Rx for sinus infection  Phone Note Call from Patient Call back at Home Phone [phone]   Caller: Patient Call For: Dr. Sharen Hones Summary of Call: Patient was seen on 11/28/10 and said she had a sinus infection but was given no medication.  She says that she is not getting any better and would like a Rx called in for either Amoxicillin or a Zpak, these both work well for her.  She also would need a Rx for Diflucan as well.  She was scheduled to see Dr. Dayton Martes today but Dr. Dayton Martes will be leaving early today and the patient rescheduled that appt.  [facility]  Please advise. Initial call taken by: Linde Gillis CMA Duncan Dull),  December 01, 2010 9:27 AM  Follow-up for Phone Call        called abx in. Follow-up by: Eustaquio Boyden  MD,  December 01, 2010 5:26 PM    New/Updated Medications: AMOXICILLIN 875 MG TABS (AMOXICILLIN) take one by mouth two times a day x 10 days FLUCONAZOLE 150 MG TABS (FLUCONAZOLE) take one x 1 Prescriptions: FLUCONAZOLE 150 MG TABS (FLUCONAZOLE) take one x 1  #1 x 0   Entered and Authorized by:   Eustaquio Boyden  MD   Signed by:   Eustaquio Boyden  MD on 12/01/2010   Method used:   [facility]        [facility]  [address] [phone]       [address]       [place], [place]  [address]       Ph: [phone] or [phone]       Fax: [phone]   RxID:   [id number] AMOXICILLIN 875 MG TABS (AMOXICILLIN) take one by mouth two times a day x 10 days  #20 x 0   Entered and Authorized by:   Eustaquio Boyden  MD   Signed by:   Eustaquio Boyden  MD on 12/01/2010   Method used:   [facility]        [facility]  [address] [phone][facility] ([facility]ail)       [address]       [place], [place]  [address]       Ph: [phone] or [phone]       Fax: [phone]   RxID:   [id number]

## 2010-12-28 NOTE — Assessment & Plan Note (Signed)
Summary: SINUS SYMPTOMS   Vital Signs:  Patient profile:   55 year old female Weight:      209.75 pounds Temp:     97.9 degrees F oral Pulse rate:   76 / minute Pulse rhythm:   regular BP sitting:   114 / 76  (left arm) Cuff size:   large  Vitals Entered By: Sydell Axon LPN (November 28, 2010 12:16 PM) CC: Sinus pain and pressure, right ear pain   History of Present Illness: CC: sinus infx?  5d h/o sinus congestion, green mucous out of nose.  + lots of congestion.  R ear pain as well as muffled.  Mainly draining at night.  + cough at night.  + sinus pressure worse with bending head forward.   No ST, abd pain, n/v/d, rashes, myalgias/arthralgias, fevers/chills.  taking claritin D, zyrtec, flonase.  pseudophed makes bp go up.  Husband stopped up as well.  Husband smokes outside.  Pt quit smoking 2 years ago.  No h/o asthma  Current Medications (verified): 1)  Diclofenac Sodium 75 Mg Tbec (Diclofenac Sodium) .... Take 1 Tablet By Mouth Two Times A Day 2)  Vivelle-Dot 0.1 Mg/24hr Pttw (Estradiol) .... Change Patch Every 3-1/2 Days 3)  Zyrtec Allergy 10 Mg Caps (Cetirizine Hcl) .... Take 1 Tablet By Mouth Once A Day 4)  Flonase 50 Mcg/act Susp (Fluticasone Propionate) .... 2 Puffs Daily in Each Nostril. 5)  Lisinopril 20 Mg Tabs (Lisinopril) .Marland Kitchen.. 1 Tablet By Mouth Daily 6)  Zantac 150 Mg Tabs (Ranitidine Hcl) .Marland Kitchen.. 1 Tab By Mouth Two Times A Day As Needed Reflux  Allergies: 1)  ! Sulfa 2)  ! Mobic (Meloxicam)  Past History:  Social History: Last updated: 11/08/2009 Lives with husband and 3 children (16, 27, 29) in [place].  Never Smoked Alcohol use-no Drug use-no Works as a Arboriculturist for [employer].  Past Medical History: Hypertension GERD allergies  Review of Systems       per HPI  Physical Exam  General:  alert.  NAD, congested Head:  + maxillary and frontal sinus tenderenss bilaterally Eyes:  PERRLA, EOMI, no injection Ears:  TMs celar  bilaterally, canals clear Nose:  nares clear, some erythema Mouth:  MMM, mild pharyngeal erythema.   Neck:  supple, no masses, and no cervical lymphadenopathy.   Lungs:  normal respiratory effort, no intercostal retractions, no accessory muscle use, and normal breath sounds.   Heart:  Normal rate and regular rhythm. S1 and S2 normal without gallop, murmur, click, rub or other extra sounds. Pulses:  2+ rad pulses, brisk cap refill Extremities:  no edema   Impression & Recommendations:  Problem # 1:  OTHER ACUTE SINUSITIS (ICD-461.8) supportive care for now.  given short duration, treat as viral sinusitis for now.  update if not better at end of week for consideration of abx, update if worsening sooner. Her updated medication list for this problem includes:    Flonase 50 Mcg/act Susp (Fluticasone propionate) .Marland Kitchen... 2 puffs daily in each nostril.    Cheratussin Ac 100-10 Mg/56ml Syrp (Guaifenesin-codeine) ..... One teaspoon at bedtime as needed cough  Complete Medication List: 1)  Diclofenac Sodium 75 Mg Tbec (Diclofenac sodium) .... Take 1 tablet by mouth two times a day 2)  Vivelle-dot 0.1 Mg/24hr Pttw (Estradiol) .... Change patch every 3-1/2 days 3)  Zyrtec Allergy 10 Mg Caps (Cetirizine hcl) .... Take 1 tablet by mouth once a day 4)  Flonase 50 Mcg/act Susp (Fluticasone propionate) .... 2 puffs  daily in each nostril. 5)  Lisinopril 20 Mg Tabs (Lisinopril) .Marland Kitchen.. 1 tablet by mouth daily 6)  Zantac 150 Mg Tabs (Ranitidine hcl) .Marland Kitchen.. 1 tab by mouth two times a day as needed reflux 7)  Cheratussin Ac 100-10 Mg/50ml Syrp (Guaifenesin-codeine) .... One teaspoon at bedtime as needed cough  Patient Instructions: 1)  You have a sinus infection.  most are viral. 2)  if worsening this week, call us for update.  If not better by friday, let us know. 3)  Hold off on zyrtec and/or claritin D for now. 4)  Take guaifenesin 400mg  IR 1 1/2 pills in am and at noon with plenty of fluid to help mobilize mucous  (or simple mucinex). 5)  Use nasal saline spray or neti pot to help drainage of sinuses. 6)  If you start having fevers >101.5, trouble swallowing or breathing, or are worsening instead of improving as expected, you may need to be seen again. 7)  Good to see you today, call clinic with questions.  Prescriptions: CHERATUSSIN AC 100-10 MG/5ML SYRP (GUAIFENESIN-CODEINE) one teaspoon at bedtime as needed cough  #100cc x 0   Entered and Authorized by:   Eustaquio Boyden  MD   Signed by:   Eustaquio Boyden  MD on 11/28/2010   Method used:   Print then Give to Patient   RxID:   [id number]    Orders Added: 1)  Est. Patient Level III [65784]    Current Allergies (reviewed today): ! SULFA ! MOBIC (MELOXICAM)

## 2010-12-28 NOTE — Assessment & Plan Note (Signed)
Summary: ?ADD/CLE   Vital Signs:  Patient profile:   55 year old female Height:      60 inches Weight:      206.25 pounds BMI:     40.43 Temp:     98.6 degrees F oral Pulse rate:   78 / minute Pulse rhythm:   regular BP sitting:   102 / 60  (left arm) Cuff size:   large  Vitals Entered By: Linde Gillis CMA Duncan Dull) (December 05, 2010 8:29 AM) CC: ? ADD   History of Present Illness: 55 yo here for ?ADD.  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 Her children have ADD and Deaysia was told since childhood that she cannot focus on tasks. Notices difficulty concentrating in class. Has never been on any stimulants or other medicaitons for ADD.   No issues with hyperactivitiy as a child that she is aware of.    Current Medications (verified): 1)  Diclofenac Sodium 75 Mg Tbec (Diclofenac Sodium) .... Take 1 Tablet By Mouth Two Times A Day 2)  Vivelle-Dot 0.1 Mg/24hr Pttw (Estradiol) .... Change Patch Every 3-1/2 Days 3)  Zyrtec Allergy 10 Mg Caps (Cetirizine Hcl) .... Take 1 Tablet By Mouth Once A Day 4)  Flonase 50 Mcg/act Susp (Fluticasone Propionate) .... 2 Puffs Daily in Each Nostril. 5)  Lisinopril 20 Mg Tabs (Lisinopril) .Marland Kitchen.. 1 Tablet By Mouth Daily 6)  Zantac 150 Mg Tabs (Ranitidine Hcl) .Marland Kitchen.. 1 Tab By Mouth Two Times A Day As Needed Reflux 7)  Cheratussin Ac 100-10 Mg/40ml Syrp (Guaifenesin-Codeine) .... One Teaspoon At Bedtime As Needed Cough 8)  Amoxicillin 875 Mg Tabs (Amoxicillin) .... Take One By Mouth Two Times A Day X 10 Days 9)  Fluconazole 150 Mg Tabs (Fluconazole) .... Take One X 1  Allergies: 1)  ! Sulfa 2)  ! Mobic (Meloxicam)  Past History:  Past Medical History: Last updated: 11/28/2010 Hypertension GERD allergies  Past Surgical History: Last updated: 11/08/2009 Hysterectomy  Family History: Last updated: 11/08/2009 Mom- alive, cervical CA at 74 Dad - alives, 76, had stroke in 1998  Social History: Last updated:  11/08/2009 Lives with husband and 3 children (16, 68, 45) in [place].  Never Smoked Alcohol use-no Drug use-no Works as a Arboriculturist for [employer].  Risk Factors: Smoking Status: never (11/08/2009)  Review of Systems      See HPI Psych:  Denies anxiety and depression.  Physical Exam  General:  alert.  NAD, congested Lungs:  normal respiratory effort, no intercostal retractions, no accessory muscle use, and normal breath sounds.   Heart:  Normal rate and regular rhythm. S1 and S2 normal without gallop, murmur, click, rub or other extra sounds. Psych:  talkative, pleasant, good eye contact.   Impression & Recommendations:  Problem # 1:  ? of ADD (ICD-314.00) Assessment New Time spent with patient 15 minutes, more than 50% of this time was spent counseling patient on ADD.  Will refer to psychology for formal evaluation.  Likely does have ADD and would benefit from stimulant use.    Orders: Psychology Referral (Psychology)  Complete Medication List: 1)  Diclofenac Sodium 75 Mg Tbec (Diclofenac sodium) .... Take 1 tablet by mouth two times a day 2)  Vivelle-dot 0.1 Mg/24hr Pttw (Estradiol) .... Change patch every 3-1/2 days 3)  Zyrtec Allergy 10 Mg Caps (Cetirizine hcl) .... Take 1 tablet by mouth once a day 4)  Flonase 50 Mcg/act Susp (Fluticasone propionate) .... 2 puffs daily in each  nostril. 5)  Lisinopril 20 Mg Tabs (Lisinopril) .Marland Kitchen.. 1 tablet by mouth daily 6)  Zantac 150 Mg Tabs (Ranitidine hcl) .Marland Kitchen.. 1 tab by mouth two times a day as needed reflux 7)  Cheratussin Ac 100-10 Mg/45ml Syrp (Guaifenesin-codeine) .... One teaspoon at bedtime as needed cough 8)  Amoxicillin 875 Mg Tabs (Amoxicillin) .... Take one by mouth two times a day x 10 days 9)  Fluconazole 150 Mg Tabs (Fluconazole) .... Take one x 1  Patient Instructions: 1)  Great to see you. 2)  Please stop by to see Shirlee Limerick on your way out. Prescriptions: VIVELLE-DOT 0.1 MG/24HR PTTW (ESTRADIOL) Change patch  every 3-1/2 days  #8 x 0   Entered and Authorized by:   Ruthe Mannan MD   Signed by:   Ruthe Mannan MD on 12/05/2010   Method used:   Print then Give to Patient   RxID:   [id number]    Orders Added: 1)  Psychology Referral [Psychology] 2)  Est. Patient Level III [69629]    Current Allergies (reviewed today): ! SULFA ! MOBIC (MELOXICAM)

## 2011-01-03 ENCOUNTER — Ambulatory Visit (INDEPENDENT_AMBULATORY_CARE_PROVIDER_SITE_OTHER): Payer: BC Managed Care – PPO | Admitting: Psychology

## 2011-01-03 DIAGNOSIS — F909 Attention-deficit hyperactivity disorder, unspecified type: Secondary | ICD-10-CM

## 2011-01-08 ENCOUNTER — Encounter: Payer: Self-pay | Admitting: Family Medicine

## 2011-01-08 ENCOUNTER — Telehealth: Payer: Self-pay | Admitting: Family Medicine

## 2011-01-08 ENCOUNTER — Ambulatory Visit (INDEPENDENT_AMBULATORY_CARE_PROVIDER_SITE_OTHER): Payer: BC Managed Care – PPO | Admitting: Family Medicine

## 2011-01-08 DIAGNOSIS — F909 Attention-deficit hyperactivity disorder, unspecified type: Secondary | ICD-10-CM | POA: Insufficient documentation

## 2011-01-09 ENCOUNTER — Encounter: Payer: Self-pay | Admitting: Family Medicine

## 2011-01-17 NOTE — Letter (Signed)
Summary: Neurocognitive Test Results  Neurocognitive Test Results   Imported By: Kassie Mends 01/12/2011 10:15:28  _____________________________________________________________________  External Attachment:    Type:   Image     Comment:   External Document

## 2011-01-17 NOTE — Progress Notes (Signed)
Summary: prior auth needed for adderall  Phone Note From Pharmacy   Caller: [facility] [address] Summary of Call: Prior Berkley Harvey is needed for adderall, form is on your desk. Initial call taken by: Lowella Petties CMA, AAMA,  January 08, 2011 5:00 PM  Follow-up for Phone Call        On my desk. Ruthe Mannan MD  January 09, 2011 7:27 AM  Forms faxed to [facility].            Lowella Petties CMA, AAMA  January 09, 2011 8:45 AM   Additional Follow-up for Phone Call Additional follow up Details #1::        Prior auth given, approval letter placed on doctor's desk for signature and scanning.             Lowella Petties CMA, AAMA  January 10, 2011 9:59 AM

## 2011-01-17 NOTE — Assessment & Plan Note (Signed)
Summary: TO GET ON MEDS FOR ADHD / LFW   Vital Signs:  Patient profile:   55 year old female Height:      60 inches Weight:      204.50 pounds BMI:     40.08 Temp:     98.6 degrees F oral Pulse rate:   79 / minute Pulse rhythm:   regular BP sitting:   110 / 72  (left arm) Cuff size:   large  Vitals Entered By: Linde Gillis CMA Duncan Dull) (January 08, 2011 10:26 AM) CC: ? medication for ADHD   History of Present Illness: 55 yo here to discuss ADHD treatment.  Evaluated by Dr. Laymond Purser on 12/12/2010 and 12/14/2010. CNS Vital signs evaluation reviewed. Her profile is consistent with ADHD. Two of her children have also formally been tested and diagnosed with ADHD.  [place] has a follow up scheduled with Dr. Laymond Purser in March.      Current Medications (verified): 1)  Diclofenac Sodium 75 Mg Tbec (Diclofenac Sodium) .... Take 1 Tablet By Mouth Two Times A Day 2)  Vivelle-Dot 0.1 Mg/24hr Pttw (Estradiol) .... Change Patch Every 3-1/2 Days 3)  Zyrtec Allergy 10 Mg Caps (Cetirizine Hcl) .... Take 1 Tablet By Mouth Once A Day 4)  Flonase 50 Mcg/act Susp (Fluticasone Propionate) .... 2 Puffs Daily in Each Nostril. 5)  Lisinopril 20 Mg Tabs (Lisinopril) .Marland Kitchen.. 1 Tablet By Mouth Daily 6)  Zantac 150 Mg Tabs (Ranitidine Hcl) .Marland Kitchen.. 1 Tab By Mouth Two Times A Day As Needed Reflux 7)  Cheratussin Ac 100-10 Mg/65ml Syrp (Guaifenesin-Codeine) .... One Teaspoon At Bedtime As Needed Cough 8)  Fluconazole 150 Mg Tabs (Fluconazole) .... Take One X 1 9)  Adderall Xr 20 Mg Xr24h-Cap (Amphetamine-Dextroamphetamine) .Marland Kitchen.. 1 By Mouth Daily  Allergies: 1)  ! Sulfa 2)  ! Mobic (Meloxicam)  Past History:  Past Medical History: Last updated: 11/28/2010 Hypertension GERD allergies  Past Surgical History: Last updated: 11/08/2009 Hysterectomy  Family History: Last updated: 11/08/2009 Mom- alive, cervical CA at 30 Dad - alives, 73, had stroke in 1998  Social History: Last updated: 11/08/2009 Lives with  husband and 3 children (16, 43, 60) in [place].  Never Smoked Alcohol use-no Drug use-no Works as a Arboriculturist for [employer].  Risk Factors: Smoking Status: never (11/08/2009)  Review of Systems      See HPI Eyes:  Denies blurring. CV:  Denies chest pain or discomfort.  Physical Exam  General:  alert.  Psych:  talkative, pleasant, good eye contact.   Impression & Recommendations:  Problem # 1:  ADHD (ICD-314.01) Assessment New Time spent with patient 15 minutes, more than 50% of this time was spent counseling patient on ADHD treatment. She would likely benefit from both behavioral modification and medication. Will start Adderrall 20 mg XL today. Follow up with Dr. Laymond Purser in March for retesting to determine efficacy.  Complete Medication List: 1)  Diclofenac Sodium 75 Mg Tbec (Diclofenac sodium) .... Take 1 tablet by mouth two times a day 2)  Vivelle-dot 0.1 Mg/24hr Pttw (Estradiol) .... Change patch every 3-1/2 days 3)  Zyrtec Allergy 10 Mg Caps (Cetirizine hcl) .... Take 1 tablet by mouth once a day 4)  Flonase 50 Mcg/act Susp (Fluticasone propionate) .... 2 puffs daily in each nostril. 5)  Lisinopril 20 Mg Tabs (Lisinopril) .Marland Kitchen.. 1 tablet by mouth daily 6)  Zantac 150 Mg Tabs (Ranitidine hcl) .Marland Kitchen.. 1 tab by mouth two times a day as needed reflux 7)  Cheratussin Ac 100-10  Mg/11ml Syrp (Guaifenesin-codeine) .... One teaspoon at bedtime as needed cough 8)  Fluconazole 150 Mg Tabs (Fluconazole) .... Take one x 1 9)  Adderall Xr 20 Mg Xr24h-cap (Amphetamine-dextroamphetamine) .Marland Kitchen.. 1 by mouth daily Prescriptions: ADDERALL XR 20 MG XR24H-CAP (AMPHETAMINE-DEXTROAMPHETAMINE) 1 by mouth daily  #30 x 0   Entered and Authorized by:   Ruthe Mannan MD   Signed by:   Ruthe Mannan MD on 01/08/2011   Method used:   Print then Give to Patient   RxID:   [id number]    Orders Added: 1)  Est. Patient Level III [44010]    Current Allergies (reviewed today): ! SULFA ! MOBIC  (MELOXICAM)

## 2011-01-23 NOTE — Medication Information (Signed)
Summary: PA and Approval for Dextroamphetamine-Amph  PA and Approval for Dextroamphetamine-Amph   Imported By: Maryln Gottron 01/15/2011 15:45:00  _____________________________________________________________________  External Attachment:    Type:   Image     Comment:   External Document

## 2011-01-29 ENCOUNTER — Telehealth: Payer: Self-pay | Admitting: Family Medicine

## 2011-01-31 ENCOUNTER — Ambulatory Visit: Payer: BC Managed Care – PPO | Admitting: Psychology

## 2011-02-06 NOTE — Progress Notes (Signed)
Summary: Adderall dosage increase  Phone Note Call from Patient Call back at Work Phone [phone]   Caller: Patient Call For: Ruthe Mannan MD Summary of Call: Patient called and stated that she wants to go up on her Adderall dose from 20mg  to 30mg .  Please advise. Initial call taken by: Linde Gillis CMA Duncan Dull),  January 29, 2011 3:18 PM  Follow-up for Phone Call        Left message on cell phone voicemail advising patient that Rx is ready for pick up will be left at front desk. Follow-up by: Linde Gillis CMA Duncan Dull),  January 29, 2011 3:35 PM    New/Updated Medications: ADDERALL 30 MG TABS (AMPHETAMINE-DEXTROAMPHETAMINE) 1 tab by mouth daily. Prescriptions: ADDERALL 30 MG TABS (AMPHETAMINE-DEXTROAMPHETAMINE) 1 tab by mouth daily.  #30 x 0   Entered and Authorized by:   Ruthe Mannan MD   Signed by:   Ruthe Mannan MD on 01/29/2011   Method used:   Print then Give to Patient   RxID:   [id number]   Appended Document: Adderall dosage increase Patient came by to pick up Rx for Adderrall 30mg  and stated that its suppose to be for the long acting not just plain Adderall.  Please advise.  Appended Document: Adderall dosage increase rx changed.

## 2011-02-07 ENCOUNTER — Ambulatory Visit: Payer: BC Managed Care – PPO | Admitting: Psychology

## 2011-02-10 ENCOUNTER — Encounter: Payer: Self-pay | Admitting: Family Medicine

## 2011-02-14 ENCOUNTER — Ambulatory Visit: Payer: BC Managed Care – PPO | Admitting: Psychology

## 2011-02-14 ENCOUNTER — Other Ambulatory Visit: Payer: Self-pay | Admitting: Family Medicine

## 2011-02-27 ENCOUNTER — Other Ambulatory Visit: Payer: Self-pay | Admitting: *Deleted

## 2011-02-27 NOTE — Telephone Encounter (Signed)
Patient would like to know if she can have a Rx for Adderall 20mg  to go along with the 30mg .  She takes the 30mg  in the morning and she says that by 7:00 she can tell that it is gone and she has class until 9:00pm, please advise.

## 2011-02-28 ENCOUNTER — Other Ambulatory Visit: Payer: Self-pay | Admitting: *Deleted

## 2011-02-28 MED ORDER — AMPHETAMINE-DEXTROAMPHET ER 20 MG PO CP24: ORAL_CAPSULE | ORAL | Status: DC

## 2011-02-28 MED ORDER — AMPHETAMINE-DEXTROAMPHET ER 30 MG PO CP24: 30.0000 mg | ORAL_CAPSULE | ORAL | Status: DC

## 2011-02-28 NOTE — Telephone Encounter (Signed)
Patient notified Rx ready for pick up will be left at front desk.

## 2011-02-28 NOTE — Telephone Encounter (Signed)
Patient notified via telephone, Rx left at front desk for pick up.

## 2011-03-26 ENCOUNTER — Other Ambulatory Visit: Payer: Self-pay | Admitting: *Deleted

## 2011-03-26 MED ORDER — AMPHETAMINE-DEXTROAMPHETAMINE 30 MG PO TABS: 30.0000 mg | ORAL_TABLET | Freq: Every day | ORAL | Status: DC

## 2011-03-26 MED ORDER — AMPHETAMINE-DEXTROAMPHET ER 20 MG PO CP24: ORAL_CAPSULE | ORAL | Status: DC

## 2011-03-26 NOTE — Telephone Encounter (Signed)
Patient notified of rx. Rx placed in front office for pick up.

## 2011-03-27 ENCOUNTER — Telehealth: Payer: Self-pay | Admitting: *Deleted

## 2011-03-27 MED ORDER — AMPHETAMINE-DEXTROAMPHET ER 30 MG PO CP24: 30.0000 mg | ORAL_CAPSULE | Freq: Every day | ORAL | Status: DC

## 2011-03-27 NOTE — Telephone Encounter (Signed)
Patient is taking the capsules not the tablets.  Rx re-written and she will pick it up tomorrow.  Corrected Rx in your IN box.

## 2011-03-28 NOTE — Telephone Encounter (Signed)
Signed, in my box. 

## 2011-04-10 NOTE — Op Note (Signed)
[name], [name]                ACCOUNT NO.:  [id number]   MEDICAL RECORD NO.:  [id number]          PATIENT TYPE:  AMB   LOCATION:  [facility]SC                          FACILITY:  [facility]   PHYSICIAN:  Robert A. Thurston Hole, M.D. DATE OF BIRTH:  [birth year]   DATE OF PROCEDURE:  DATE OF DISCHARGE:                               OPERATIVE REPORT   PREOPERATIVE DIAGNOSIS:  Left knee medial and lateral meniscal tears.   POSTOPERATIVE DIAGNOSES:  Left knee medial and lateral meniscal tears.   PROCEDURES:  Left knee examination under anesthesia, followed by  arthroscopic partial medial and lateral meniscectomies.   SURGEON:  Elana Alm. Thurston Hole, M.D.   ANESTHESIA:  General.   OPERATIVE TIME:  30 minutes.   COMPLICATIONS:  None.   INDICATION FOR PROCEDURE:  Michelle Hoffman is a 55 year old woman who has  had significant left knee pain for the past 4 to 5 months.  Significant  pain with twisting and turning.  Exam on MRI has revealed a medial  meniscus tear, partial lateral meniscus tear.  She has failed  conservative care, and is now to undergo arthroscopy.   DESCRIPTION:  Michelle Hoffman was brought to the operating room on November 15, 2008, after a knee block was placed in holding area by anesthesia.  She was placed on operating table in supine position.  Her left knee was  examined under anesthesia.  She had full range of motion of her knee  with stable ligamentous exam with normal patellar tracking. Left leg was  prepped using sterile DuraPrep and draped using sterile technique.  Originally, through an anterolateral portal, the arthroscope with a pump  attached was placed into an anteromedial portal, and arthroscopic probe  was placed.  On initial inspection of the medial compartment, there were  2 articular cartilage showed grade 1 and 2 chondromalacia.  Medial  meniscus showed tearing of the posterior and medial horn, of which 40%  to 50% was resected back to a stable rim.  Intercondylar notch  inspected.  Anterior and posterior crucial ligaments were normal.  Lateral compartment was inspected.  The articular cartilage was normal.  Lateral meniscus showed 30% posterolateral corner tear which was  resected back to a stable rim.  Patellofemoral joint, articular  cartilage was normal.  The patella tracked normally.  Medial and lateral  gutters were free of pathology. At this time, it was felt that all  pathology have been satisfactorily addressed.  The instruments were  removed. The portals were closed with 3-0 nylon suture and injected with  0.25% Marcaine with epinephrine and 4 mg of morphine.  Sterile dressings  were applied, and the patient awakened and taken to the recovery room in  stable condition.   FOLLOWUP CARE:  Mr. Flicker will be followed as an outpatient on Vicodin  and diclofenac.  She will be seen back in the office in a week for  sutures out and followup.      Robert A. Thurston Hole, M.D.  Electronically Signed     RAW/MEDQ  D:  11/15/2008  T:  11/15/2008  Job:  [id number]

## 2011-04-10 NOTE — Op Note (Signed)
NAME:  Michelle Hoffman, Michelle Hoffman                ACCOUNT NO.:  [id number]   MEDICAL RECORD NO.:  [id number]          PATIENT TYPE:  AMB   LOCATION:  SDC                           FACILITY:  [facility]   PHYSICIAN:  Guy Sandifer. Henderson Cloud, M.D. DATE OF BIRTH:  [birth year]   DATE OF PROCEDURE:  06/03/2007  DATE OF DISCHARGE:                               OPERATIVE REPORT   PREOPERATIVE DIAGNOSIS:  Menorrhagia.   POSTOPERATIVE DIAGNOSIS:  Menorrhagia and abdominopelvic adhesions.   PROCEDURE:  Laparoscopically-assisted vaginal hysterectomy with  bilateral salpingo-oophorectomy and extensive lysis of adhesions.   SURGEON:  Guy Sandifer. Henderson Cloud, M.D.   ASSISTANT:  Richarda Overlie, M.D.   ANESTHESIA:  General endotracheal intubation.   SPECIMENS:  Uterus, bilateral tubes and ovaries to pathology.   ESTIMATED BLOOD LOSS:  150 mL.   INDICATIONS:  This patient is a 55 year old married white female G4, P3  with heavy menses.  Details dictated history and physical.  Laparoscopically-assisted vaginal hysterectomy with bilateral salpingo-  oophorectomy has been discussed with the patient preoperatively.  Potential risks and complications were discussed preoperatively  including but not limited to infection, organ damage, bleeding requiring  transfusion of blood products with possible transfusion reaction, HIV  and hepatitis acquisition, DVT, PE, pneumonia, fistula formation,  postoperative dyspareunia, laparotomy.  All questions were answered and  consent is signed on the chart.   FINDINGS:  Upper abdomen is grossly normal.  There are omental adhesions  to the anterior abdominal wall and the midline from below the umbilicus  down to the level of the uterus.  There are omental adhesions to the  uterine fundus.  The bladder flap is also adhesed across the lower  uterine segment.  And the broad ligaments are all widely adherent along  the pelvic sidewall.  The ovaries are normal and the posterior cul-de-  sac is  normal.   PROCEDURE:  The patient is taken to operating room where she is  identified, placed in dorsosupine position.  General anesthesia is  induced via endotracheal intubation.  She is placed in dorsal lithotomy  position where she is prepped abdominally and vaginally, bladder  straight catheterized.  Hulka was placed in the uterus as a manipulator  and she is draped in a sterile fashion.  The infraumbilical, suprapubic  and right and left lower quadrant areas are injected with 0.5% plain  Marcaine.  A small infraumbilical incision is made and a disposable  Veress needle was placed without difficulty.  Normal syringe and drop  test are noted.  2 liters of gas were insufflated under low pressure.  The Veress needle was then removed and a 10/11 XL bladeless disposable  trocar sleeve was then placed using direct visualization with the  diagnostic laparoscope.  After placement the operative laparoscope was  placed.  Suprapubic and then the right left lower quadrant small  incisions were made and 5-mm XL bladeless disposable trocar sleeves were  placed under direct visualization without difficulty.  The above  findings were noted.  The omental adhesions to the anterior abdominal  wall were taken down through the umbilical trocar sleeve with  scissors  prior to placement the inferior trocar sleeves.  Good hemostasis was  maintained there.  After placement of the inferior trocar sleeves the  omental adhesions to the uterine fundus were taken down as well.  The  adhesions of the broad ligament were taken down sharply and bluntly.  Then using the gyrus bipolar cautery cutting instrument the right  infundibulopelvic ligament is taken down and is carried across the round  ligament and down to the level vesicouterine peritoneum.  This was done  bilaterally.  The widespread adhesions of the round ligaments are fairly  dense.  However, the dissection is carried out along the margins of the  uterine  fundus in the typical fashion.  Good hemostasis is noted.  Instruments are removed.  Attention is turned to vagina.  The posterior  cul-de-sac is entered sharply and the cervix is circumscribed with  unipolar cautery.  Mucosa is advanced sharply and bluntly.  The gyrus  bipolar cautery instrument is used to take down the pedicles and the  uterosacral ligaments followed by the bladder pillars, cardinal  ligaments and the uterine vessels were taken bilaterally.  The fundus is  delivered posteriorly and the remaining pedicles were taken down.  The  uterosacral ligaments are then plicated, the vaginal cuff with separate  sutures of 0 Monocryl.  All suture will be 0 Monocryl unless otherwise  designated.  Uterosacral ligaments then plicated midline with a third  suture.  Cuff was closed with figure-of-eights.  Foley catheter is  placed in the bladder and clear urine is noted.  Attention is returned  to the abdomen.  Minor bleeding at peritoneal edges is controlled with  bipolar cautery.  Copious irrigation is carried out.  Inspection under  reduced pneumoperitoneum reveals good hemostasis.  Inferior trocar  sleeves were removed.  The pneumoperitoneum is reduced and the umbilical  trocar sleeve was removed.  The umbilical incision is closed with 0  Vicryl suture in the subcutaneous tissue with care being taken not to  pick up any underlying structures.  Skin on the umbilical incision is  closed with interrupted 2-0 Vicryl.  All the incisions were closed with  Dermabond.  All counts correct.  The patient is awakened, taken to  recovery room in stable condition.      Guy Sandifer Henderson Cloud, M.D.  Electronically Signed     JET/MEDQ  D:  06/03/2007  T:  06/03/2007  Job:  [id number]

## 2011-04-10 NOTE — H&P (Signed)
NAME:  Michelle Hoffman, Michelle Hoffman                ACCOUNT NO.:  [id number]   MEDICAL RECORD NO.:  [id number]          PATIENT TYPE:  AMB   LOCATION:  SDC                           [facility]ILITY:  [facility]   PHYSICIAN:  Guy Sandifer. Henderson Cloud, M.D. DATE OF BIRTH:  [birth year]   DATE OF ADMISSION:  06/03/2007  DATE OF DISCHARGE:                              HISTORY & PHYSICAL   CHIEF COMPLAINT:  Heavy irregular menses.   HISTORY OF PRESENT ILLNESS:  This patient is a 55 year old married white  female, gravida 4, para 3, who is having menses about every 24 days,  lasting 5-7 days a piece.  She has heavy flows and clotting, changing  pads every 1-2 hours.  Ultrasound in my office on Apr 16, 2007, reveals  the uterus measuring 9.1x5.2x5.9 cm.  Sonohystogram is negative for  intracavitary masses.  After discussion of options, she is being  admitted for laparoscopically assisted vaginal hysterectomy and  bilateral salpingo-oophorectomy.  Potential risks, complications have  been discussed preoperatively.  Issues of menopause have also been  reviewed.   PAST MEDICAL HISTORY:  1. Reflux.  2. 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   PAST SURGICAL HISTORY:  Negative.   OBSTETRICAL HISTORY:  Cesarean section x3, miscarriage with D&C x1.   MEDICATIONS:  1. Ibuprofen.  2. Prilosec.  3. Sudafed p.r.n.   ALLERGIES:  No known drug allergies .   SOCIAL HISTORY:  Denies tobacco, alcohol or drug abuse.   FAMILY HISTORY:  Diabetes in mother.  Cancer in grandfather.  Stroke in  father.  Congestive heart failure paternal grandmother.  Thyroid  disorder in mother.   REVIEW OF SYSTEMS:  NEUROLOGICAL:  Denies headache.  CARDIAC:  Denies  chest pain.  PULMONARY:  Denies shortness of breath.  GI:  Denies recent  changes in bowel habits.   PHYSICAL EXAMINATION:  VITAL SIGNS:  Height 5 feet 0 inches, weight 194  pounds, blood pressure 124/70.  HEENT:  Without thyromegaly.  LUNGS:  Clear to auscultation.  HEART:  Regular rate  and rhythm.  BACK:  Without CVA tenderness.  BREASTS:  Without mass or discharge.  ABDOMEN:  Obese, soft, nontender without palpable masses.  PELVIC:  Vulva, vagina and cervix without lesion.  Uterus is 6-8 weeks  in size, mobile, nontender.  Adnexa nontender without palpable masses.  EXTREMITIES:  Grossly within normal limits.  NEUROLOGICAL:  Grossly within normal limits.   ASSESSMENT:  Menometrorrhagia.   PLAN:  Laparoscopic-assisted vaginal hysterectomy with bilateral  salpingo-oophorectomy.      Guy Sandifer Henderson Cloud, M.D.  Electronically Signed     JET/MEDQ  D:  05/28/2007  T:  05/28/2007  Job:  [id number]

## 2011-04-11 ENCOUNTER — Ambulatory Visit (INDEPENDENT_AMBULATORY_CARE_PROVIDER_SITE_OTHER): Payer: BC Managed Care – PPO | Admitting: Family Medicine

## 2011-04-11 ENCOUNTER — Encounter: Payer: Self-pay | Admitting: Family Medicine

## 2011-04-11 VITALS — BP 120/72 | HR 95 | Temp 98.5°F | Ht 60.0 in | Wt 179.4 lb

## 2011-04-11 DIAGNOSIS — R229 Localized swelling, mass and lump, unspecified: Secondary | ICD-10-CM

## 2011-04-11 DIAGNOSIS — R2230 Localized swelling, mass and lump, unspecified upper limb: Secondary | ICD-10-CM

## 2011-04-11 DIAGNOSIS — M62838 Other muscle spasm: Secondary | ICD-10-CM | POA: Insufficient documentation

## 2011-04-11 MED ORDER — CYCLOBENZAPRINE HCL 5 MG PO TABS: 5.0000 mg | ORAL_TABLET | Freq: Three times a day (TID) | ORAL | Status: DC | PRN

## 2011-04-11 NOTE — Progress Notes (Signed)
55 yo here for:  1.  Neck pain- has known DJD of cervical spine. Very active job as Copy, carries heavy containers. Was lifting something a few days ago, noticed pain in her upper neck that night. No radiculopathy or UE weakness. Tylenol does help.  2.  Lump under left arm pit- has not grown in size. Noticed it months ago. No erythema, pain or warmth.  No drainage. Overdue for screening mammogram. No h/o breast CA. No unusual lumps or bumps in breast, changes in nipples or nipple discharge.  The PMH, PSH, Social History, Family History, Medications, and allergies have been reviewed in [facility], and have been updated if relevant.  ROS: See HPI  Patient reports no  vision/ hearing changes,anorexia, weight change, fever ,a persistant / recurrent hoarseness, swallowing issues, chest pain, edema,persistant / recurrent cough, hemoptysis, dyspnea(rest, exertional, paroxysmal nocturnal), gastrointestinal  bleeding (melena, rectal bleeding), abdominal pain, excessive heart burn, GU symptoms(dysuria, hematuria, pyuria, voiding/incontinence  Issues) syncope, focal weakness, severe memory loss, concerning skin lesions, depression, anxiety    Physical exam: BP 120/72  Pulse 95  Temp(Src) 98.5 F (36.9 C) (Oral)  Ht 5' (1.524 m)  Wt 179 lb 6.4 oz (81.375 kg)  BMI 35.04 kg/m2  General:  Well-developed,well-nourished,in no acute distress; alert,appropriate and cooperative throughout examination Neck:  FROM without difficulty, spurling neg, + left trap tightness. Breasts:  No mass, nodules, thickening, tenderness, bulging, retraction, inflamation, nipple discharge or skin changes noted.   Axillary Nodes:  No palpable lymphadenopathy Left axillay- small, 1 cm firm mass, non indurated, freely movable, no erythema or warmth Psych:  Cognition and judgment appear intact. Alert and cooperative with normal attention span and concentration. No apparent delusions, illusions, hallucinations

## 2011-04-11 NOTE — Assessment & Plan Note (Signed)
New. Discussed exercises, rx for Flexeril given. See pt instructions for details.

## 2011-04-11 NOTE — Assessment & Plan Note (Signed)
New. Likely benign, cystic lesion. Will get diagnostic mammogram/ultrasound as she is overdue for mammogram to evaluate further.

## 2011-04-11 NOTE — Patient Instructions (Signed)
Please stop by to see Michelle Hoffman on your way out to set up your mammogram.  Try to avoid painful activities (overhead activities, lifting with extended arm) as much as possible. Aleve and/or tylenol as needed for pain Flexeril as needed at night.   If not improving at follow-up we will consider ultrasound and/or physical therapy.

## 2011-05-03 ENCOUNTER — Other Ambulatory Visit: Payer: Self-pay | Admitting: *Deleted

## 2011-05-03 MED ORDER — AMPHETAMINE-DEXTROAMPHET ER 20 MG PO CP24: ORAL_CAPSULE | ORAL | Status: DC

## 2011-05-03 MED ORDER — AMPHETAMINE-DEXTROAMPHET ER 30 MG PO CP24: 30.0000 mg | ORAL_CAPSULE | ORAL | Status: DC

## 2011-05-03 NOTE — Telephone Encounter (Signed)
Please call pt when ready.

## 2011-05-07 MED ORDER — AMPHETAMINE-DEXTROAMPHET ER 30 MG PO CP24: 30.0000 mg | ORAL_CAPSULE | ORAL | Status: DC

## 2011-05-07 NOTE — Telephone Encounter (Signed)
Patient advised that her and her daughters Rx's are ready for pick up will be left at front desk.

## 2011-05-23 ENCOUNTER — Other Ambulatory Visit: Payer: Self-pay | Admitting: Family Medicine

## 2011-05-23 ENCOUNTER — Ambulatory Visit
Admission: RE | Admit: 2011-05-23 | Discharge: 2011-05-23 | Disposition: A | Discharge status: Home / Self Care | Payer: BC Managed Care – PPO | Source: Ambulatory Visit | Attending: Family Medicine | Admitting: Family Medicine

## 2011-05-23 DIAGNOSIS — R2230 Localized swelling, mass and lump, unspecified upper limb: Secondary | ICD-10-CM

## 2011-06-07 ENCOUNTER — Telehealth: Payer: Self-pay | Admitting: *Deleted

## 2011-06-07 NOTE — Telephone Encounter (Signed)
Was advised by Aram Beecham that patient called and left a message for me to call her.  Called her back on cell phone and left a message on her voicemail for her to return my call.

## 2011-06-07 NOTE — Telephone Encounter (Signed)
Patient called back to let Dr. Dayton Martes know that she will be having surgery on her right foot next Friday due to Plantar Fasciitis.

## 2011-06-07 NOTE — Telephone Encounter (Signed)
Noted! Thank you

## 2011-06-11 ENCOUNTER — Other Ambulatory Visit: Payer: Self-pay | Admitting: *Deleted

## 2011-06-11 NOTE — Telephone Encounter (Signed)
Please call pt when ready.

## 2011-06-12 MED ORDER — AMPHETAMINE-DEXTROAMPHET ER 30 MG PO CP24: 30.0000 mg | ORAL_CAPSULE | ORAL | Status: DC

## 2011-06-12 MED ORDER — AMPHETAMINE-DEXTROAMPHET ER 20 MG PO CP24: ORAL_CAPSULE | ORAL | Status: DC

## 2011-06-12 NOTE — Telephone Encounter (Signed)
Patient notified via telephone that the Rx's are ready for pick up for her and her daughter.

## 2011-07-10 ENCOUNTER — Other Ambulatory Visit: Payer: Self-pay | Admitting: *Deleted

## 2011-07-10 MED ORDER — AMPHETAMINE-DEXTROAMPHET ER 30 MG PO CP24: 30.0000 mg | ORAL_CAPSULE | ORAL | Status: DC

## 2011-07-10 MED ORDER — AMPHETAMINE-DEXTROAMPHET ER 20 MG PO CP24: ORAL_CAPSULE | ORAL | Status: DC

## 2011-07-10 NOTE — Telephone Encounter (Signed)
Please call patient when Rx is ready for pickup

## 2011-07-12 ENCOUNTER — Other Ambulatory Visit: Payer: Self-pay

## 2011-07-12 MED ORDER — AMPHETAMINE-DEXTROAMPHET ER 20 MG PO CP24: ORAL_CAPSULE | ORAL | Status: DC

## 2011-07-12 MED ORDER — AMPHETAMINE-DEXTROAMPHET ER 30 MG PO CP24: 30.0000 mg | ORAL_CAPSULE | ORAL | Status: DC

## 2011-07-12 NOTE — Telephone Encounter (Signed)
Patient notified as instructed by telephone. Prescription left at front desk.  

## 2011-07-17 ENCOUNTER — Ambulatory Visit: Payer: BC Managed Care – PPO | Admitting: Family Medicine

## 2011-07-17 DIAGNOSIS — Z029 Encounter for administrative examinations, unspecified: Secondary | ICD-10-CM

## 2011-08-20 ENCOUNTER — Other Ambulatory Visit: Payer: Self-pay | Admitting: *Deleted

## 2011-08-20 MED ORDER — AMPHETAMINE-DEXTROAMPHET ER 20 MG PO CP24: ORAL_CAPSULE | ORAL | Status: DC

## 2011-08-20 MED ORDER — AMPHETAMINE-DEXTROAMPHET ER 30 MG PO CP24: 30.0000 mg | ORAL_CAPSULE | ORAL | Status: DC

## 2011-08-20 NOTE — Telephone Encounter (Signed)
Please call patient when ready. 

## 2011-08-21 NOTE — Telephone Encounter (Signed)
Patient advised, Rx ready for pick up will be left at front desk.

## 2011-08-31 LAB — BASIC METABOLIC PANEL
Calcium: 9.2 mg/dL (ref 8.4–10.5)
GFR calc Af Amer: >60 mL/min (ref >60)
GFR calc non Af Amer: >60 mL/min (ref >60)
Sodium: 139 mEq/L (ref 135–145)

## 2011-09-05 ENCOUNTER — Encounter: Payer: Self-pay | Admitting: Family Medicine

## 2011-09-05 ENCOUNTER — Ambulatory Visit (INDEPENDENT_AMBULATORY_CARE_PROVIDER_SITE_OTHER): Payer: BC Managed Care – PPO | Admitting: Family Medicine

## 2011-09-05 VITALS — BP 110/70 | HR 86 | Temp 98.2°F | Ht 60.0 in | Wt 168.8 lb

## 2011-09-05 DIAGNOSIS — R229 Localized swelling, mass and lump, unspecified: Secondary | ICD-10-CM

## 2011-09-05 DIAGNOSIS — R2230 Localized swelling, mass and lump, unspecified upper limb: Secondary | ICD-10-CM

## 2011-09-05 NOTE — Progress Notes (Signed)
  Lump under left arm pit- has been there for months, now growing in size a little. Mammogram negative in 04/2011.    No erythema, pain or warmth.  No drainage.  No h/o breast CA.  No unusual lumps or bumps in breast, changes in nipples or nipple discharge.  The PMH, PSH, Social History, Family History, Medications, and allergies have been reviewed in [facility], and have been updated if relevant.  ROS: See HPI  Patient reports no  vision/ hearing changes,anorexia, weight change, fever ,a persistant / recurrent hoarseness, swallowing issues, chest pain, edema,persistant / recurrent cough, hemoptysis, dyspnea(rest, exertional, paroxysmal nocturnal), gastrointestinal  bleeding (melena, rectal bleeding), abdominal pain, excessive heart burn, GU symptoms(dysuria, hematuria, pyuria, voiding/incontinence  Issues) syncope, focal weakness, severe memory loss, concerning skin lesions, depression, anxiety    Physical exam: BP 110/70  Pulse 86  Temp(Src) 98.2 F (36.8 C) (Oral)  Ht 5' (1.524 m)  Wt 168 lb 12 oz (76.544 kg)  BMI 32.96 kg/m2  General:  Well-developed,well-nourished,in no acute distress; alert,appropriate and cooperative throughout examination Neck:  FROM without difficulty, spurling neg,  Breasts:  No mass, nodules, thickening, tenderness, bulging, retraction, inflamation, nipple discharge or skin changes noted.   Axillary Nodes:  No palpable lymphadenopathy Left axillay- small, 1.5 cm firm mass, non indurated, freely movable, no erythema or warmth Psych:  Cognition and judgment appear intact. Alert and cooperative with normal attention span and concentration. No apparent delusions, illusions, hallucinations  Assessment and Plan: 1. Axillary lump    Deteriorated. Likely a sebaceous currently not infected. Advised removal if it continues to grow. Pt agreed.  She will keep an eye on it.

## 2011-09-11 LAB — CBC
Hemoglobin: 11.5 — ABNORMAL LOW
MCHC: 33.1
MCV: 75.7 — ABNORMAL LOW
Platelets: 341
RBC: 4.66
RDW: 16.1 — ABNORMAL HIGH
WBC: 12.1 — ABNORMAL HIGH
WBC: 8.4

## 2011-09-11 LAB — COMPREHENSIVE METABOLIC PANEL
ALT: 20
Alkaline Phosphatase: 44
Chloride: 106
Glucose, Bld: 91
Potassium: 3.7
Sodium: 139
Total Protein: 6.9

## 2011-09-19 ENCOUNTER — Ambulatory Visit: Payer: BC Managed Care – PPO | Admitting: Family Medicine

## 2011-09-20 ENCOUNTER — Encounter: Payer: Self-pay | Admitting: Family Medicine

## 2011-09-20 ENCOUNTER — Ambulatory Visit (INDEPENDENT_AMBULATORY_CARE_PROVIDER_SITE_OTHER): Payer: BC Managed Care – PPO | Admitting: Family Medicine

## 2011-09-20 VITALS — BP 108/70 | HR 96 | Temp 98.0°F | Ht 60.0 in | Wt 168.2 lb

## 2011-09-20 DIAGNOSIS — T148XXA Other injury of unspecified body region, initial encounter: Secondary | ICD-10-CM | POA: Insufficient documentation

## 2011-09-20 DIAGNOSIS — X58XXXA Exposure to other specified factors, initial encounter: Secondary | ICD-10-CM

## 2011-09-20 DIAGNOSIS — Z23 Encounter for immunization: Secondary | ICD-10-CM

## 2011-09-20 DIAGNOSIS — IMO0002 Reserved for concepts with insufficient information to code with codable children: Secondary | ICD-10-CM

## 2011-09-20 NOTE — Progress Notes (Signed)
  Subjective:    Patient ID: Michelle Hoffman, female    DOB: 12/24/1955, 55 y.o.   MRN: [id number]  HPI  55 yo very pleasant female here for puncture wound of left hand. Was at work and picked up a tool that had a rusty nail on it. Nail punctured between 3rd and 4th digits. Was bleeding and swollen when first happened but now ok. No surrounding redness or warmth. Did not go all the way through hand.  Last Tdap in 2003.  Patient Active Problem List  Diagnoses  . ACUTE SWIMMERS EAR  . HYPERTENSION  . GERD  . DERMATITIS, ALLERGIC  . LATERAL EPICONDYLITIS, LEFT  . ADHD  . Axillary lump  . Trapezius muscle spasm  . Puncture wound   Past Medical History  Diagnosis Date  . Hypertension   . GERD (gastroesophageal reflux disease)   . Allergy    Past Surgical History  Procedure Date  . Abdominal hysterectomy    History  Substance Use Topics  . Smoking status: Never Smoker   . Smokeless tobacco: Not on file  . Alcohol Use: No   Family History  Problem Relation Age of Onset  . Cervical cancer Mother 53  . Stroke Father     in 1998   Allergies  Allergen Reactions  . Meloxicam     REACTION: Tongue burns  . Sulfonamide Derivatives     REACTION: Rash   Current Outpatient Prescriptions on File Prior to Visit  Medication Sig Dispense Refill  . amphetamine-dextroamphetamine (ADDERALL XR) 20 MG 24 hr capsule Take one tablet by mouth every evening  30 capsule  0  . amphetamine-dextroamphetamine (ADDERALL XR) 30 MG 24 hr capsule Take 1 capsule (30 mg total) by mouth every morning.  30 capsule  0  . fluticasone (FLONASE) 50 MCG/ACT nasal spray USE 2 SPRAYS IN EACH NOSTRIL DAILY  1 g  2  . ranitidine (ZANTAC) 150 MG tablet Take 150 mg by mouth 2 (two) times daily.           Review of Systems    See HPI No fevers or chills. Objective:   Physical Exam BP 108/70  Pulse 96  Temp(Src) 98 F (36.7 C) (Oral)  Ht 5' (1.524 m)  Wt 168 lb 4 oz (76.318 kg)  BMI 32.86  kg/m2  General:  Well-developed,well-nourished,in no acute distress; alert,appropriate and cooperative throughout examination Head:  normocephalic and atraumatic.   Skin:  Left hand:  Small puncture wound between 3rd and 4th digits.  No surrounding erythema, warmth or drainage. Psych:  Cognition and judgment appear intact. Alert and cooperative with normal attention span and concentration. No apparent delusions, illusions, hallucinations     Assessment & Plan:   1. Puncture wound     New.  Appears to be healing well. Tetanus booster given today. The patient indicates understanding of these issues and agrees with the plan.

## 2011-10-10 ENCOUNTER — Other Ambulatory Visit: Payer: Self-pay | Admitting: *Deleted

## 2011-10-10 MED ORDER — AMPHETAMINE-DEXTROAMPHET ER 20 MG PO CP24: ORAL_CAPSULE | ORAL | Status: DC

## 2011-10-10 MED ORDER — AMPHETAMINE-DEXTROAMPHET ER 30 MG PO CP24: 30.0000 mg | ORAL_CAPSULE | ORAL | Status: DC

## 2011-10-10 NOTE — Telephone Encounter (Signed)
Please call pt when ready.

## 2011-10-10 NOTE — Telephone Encounter (Signed)
Rx in your IN box for signature. 

## 2011-10-11 NOTE — Telephone Encounter (Signed)
Left message on cell phone voicemail advising patient that her Rx as well as her daughter's Rx is ready for pick up will be left at front desk.

## 2011-11-01 ENCOUNTER — Other Ambulatory Visit: Payer: Self-pay | Admitting: Family Medicine

## 2011-11-23 ENCOUNTER — Other Ambulatory Visit: Payer: Self-pay | Admitting: Internal Medicine

## 2011-11-23 MED ORDER — AMPHETAMINE-DEXTROAMPHET ER 30 MG PO CP24: 30.0000 mg | ORAL_CAPSULE | ORAL | Status: DC

## 2011-11-23 MED ORDER — AMPHETAMINE-DEXTROAMPHET ER 20 MG PO CP24: ORAL_CAPSULE | ORAL | Status: DC

## 2011-11-23 NOTE — Telephone Encounter (Signed)
Refill request

## 2011-11-23 NOTE — Telephone Encounter (Signed)
Rx ready for pick up will be left at front desk.  Patient advised via message left on cell phone voicemail.

## 2011-11-26 ENCOUNTER — Other Ambulatory Visit: Payer: Self-pay | Admitting: Family Medicine

## 2012-01-01 ENCOUNTER — Telehealth: Payer: Self-pay | Admitting: Family Medicine

## 2012-01-02 NOTE — Telephone Encounter (Signed)
Left message on cell phone voicemail for patient to return call. 

## 2012-01-02 NOTE — Telephone Encounter (Signed)
Left message on cell phone voicemail advising patient that she needs to be seen.  Advised her to call back and schedule an appt.

## 2012-01-02 NOTE — Telephone Encounter (Signed)
Unfortunately she needs to be seen first.

## 2012-01-02 NOTE — Telephone Encounter (Signed)
Spoke with patient and she will call her insurance company and have them fax to our office the information regarding her Rx's.  Patient also stated that she thinks she has a sinus infection and wanted to know if Dr. Dayton Martes would call her in an antibiotic or if she needed to be seen.  I advised patient that she probably will need to be seen but would ask Dr. Dayton Martes first.  Please advise.

## 2012-01-04 ENCOUNTER — Ambulatory Visit: Payer: BC Managed Care – PPO | Admitting: Family Medicine

## 2012-01-08 ENCOUNTER — Telehealth: Payer: Self-pay | Admitting: *Deleted

## 2012-01-08 NOTE — Telephone Encounter (Signed)
Patient called and asked if we would call and get prior authorization for her Adderall.  She provided the phone number [phone].  Called and they will be sending PA form to our office today.  Case number [id number].

## 2012-01-08 NOTE — Telephone Encounter (Signed)
Received PA forms for both Adderall XR 20mg  and 30mg  tablets.  Both forms are in your IN box.

## 2012-01-08 NOTE — Telephone Encounter (Signed)
Form completed and in my box.

## 2012-01-08 NOTE — Telephone Encounter (Signed)
Received PA for Adderall XR 20mg  and 30mg .  PA form sent to be scanned.

## 2012-01-08 NOTE — Telephone Encounter (Signed)
PA forms faxed to [employer].

## 2012-01-09 ENCOUNTER — Ambulatory Visit (INDEPENDENT_AMBULATORY_CARE_PROVIDER_SITE_OTHER): Payer: BC Managed Care – PPO | Admitting: Family Medicine

## 2012-01-09 ENCOUNTER — Encounter: Payer: Self-pay | Admitting: Family Medicine

## 2012-01-09 VITALS — BP 110/70 | HR 96 | Temp 98.4°F | Wt 168.8 lb

## 2012-01-09 DIAGNOSIS — J069 Acute upper respiratory infection, unspecified: Secondary | ICD-10-CM

## 2012-01-09 MED ORDER — AMPHETAMINE-DEXTROAMPHET ER 20 MG PO CP24: ORAL_CAPSULE | ORAL | Status: DC

## 2012-01-09 MED ORDER — FLUCONAZOLE 150 MG PO TABS: 150.0000 mg | ORAL_TABLET | Freq: Once | ORAL | Status: AC

## 2012-01-09 MED ORDER — AMPHETAMINE-DEXTROAMPHET ER 30 MG PO CP24: 30.0000 mg | ORAL_CAPSULE | ORAL | Status: DC

## 2012-01-09 MED ORDER — HYDROCOD POLST-CHLORPHEN POLST 10-8 MG/5ML PO LQCR: 5.0000 mL | Freq: Every evening | ORAL | Status: DC | PRN

## 2012-01-09 MED ORDER — AMOXICILLIN-POT CLAVULANATE 875-125 MG PO TABS: 1.0000 | ORAL_TABLET | Freq: Two times a day (BID) | ORAL | Status: AC

## 2012-01-09 NOTE — Patient Instructions (Signed)
Take antibiotic as directed.  Drink lots of fluids.  Treat sympotmatically with Mucinex, nasal saline irrigation, and Tylenol/Ibuprofen. Also try claritin D or zyrtec D over the counter- two times a day as needed ( have to sign for them at pharmacy). You can use warm compresses.  Cough suppressant at night. Call if not improving as expected in 5-7 days.    

## 2012-01-09 NOTE — Progress Notes (Signed)
SUBJECTIVE:  Michelle Hoffman is a 56 y.o. female who complains of coryza, congestion, swollen glands, post nasal drip and bilateral sinus pain for 14 days. She denies a history of anorexia, chest pain and dizziness and denies a history of asthma. Patient denies smoke cigarettes.   Patient Active Problem List  Diagnoses  . ACUTE SWIMMERS EAR  . HYPERTENSION  . GERD  . DERMATITIS, ALLERGIC  . LATERAL EPICONDYLITIS, LEFT  . ADHD  . Axillary lump  . Trapezius muscle spasm  . Puncture wound   Past Medical History  Diagnosis Date  . Hypertension   . GERD (gastroesophageal reflux disease)   . Allergy    Past Surgical History  Procedure Date  . Abdominal hysterectomy    History  Substance Use Topics  . Smoking status: Never Smoker   . Smokeless tobacco: Not on file  . Alcohol Use: No   Family History  Problem Relation Age of Onset  . Cervical cancer Mother 53  . Stroke Father     in 1998   Allergies  Allergen Reactions  . Meloxicam     REACTION: Tongue burns  . Sulfonamide Derivatives     REACTION: Rash   Current Outpatient Prescriptions on File Prior to Visit  Medication Sig Dispense Refill  . amphetamine-dextroamphetamine (ADDERALL XR) 20 MG 24 hr capsule Take one tablet by mouth every evening  30 capsule  0  . amphetamine-dextroamphetamine (ADDERALL XR) 30 MG 24 hr capsule Take 1 capsule (30 mg total) by mouth every morning.  30 capsule  0  . fluticasone (FLONASE) 50 MCG/ACT nasal spray USE 2 SPRAYS IN EACH NOSTRIL DAILY  16 g  3  . ranitidine (ZANTAC) 150 MG tablet Take 150 mg by mouth 2 (two) times daily.         The PMH, PSH, Social History, Family History, Medications, and allergies have been reviewed in [facility], and have been updated if relevant.  OBJECTIVE: BP 110/70  Pulse 96  Temp(Src) 98.4 F (36.9 C) (Oral)  Wt 168 lb 12 oz (76.544 kg)  She appears well, vital signs are as noted. Ears normal.  Throat and pharynx normal.  Neck supple. No adenopathy in the  neck. Nose is congested. Sinuses tender to palp throughout. The chest is clear, without wheezes or rales.  ASSESSMENT:  sinusitis  PLAN: Given duration and progression of symptoms, will treat for bacterial sinusitis with Augmentin. Symptomatic therapy suggested: push fluids, rest and return office visit prn if symptoms persist or worsen.  Call or return to clinic prn if these symptoms worsen or fail to improve as anticipated.

## 2012-02-04 ENCOUNTER — Other Ambulatory Visit: Payer: Self-pay | Admitting: *Deleted

## 2012-02-04 NOTE — Telephone Encounter (Signed)
Patient called to let us know that she needs new Rx's for both Adderall.  She stated that the pharmacy would not take the last two Rx's that we wrote because the date on the Rx's were before the PA approval.  Please advise.

## 2012-02-05 MED ORDER — AMPHETAMINE-DEXTROAMPHET ER 20 MG PO CP24: ORAL_CAPSULE | ORAL | Status: DC

## 2012-02-05 MED ORDER — AMPHETAMINE-DEXTROAMPHET ER 30 MG PO CP24: 30.0000 mg | ORAL_CAPSULE | ORAL | Status: DC

## 2012-02-05 NOTE — Telephone Encounter (Signed)
Advised pt scripts are ready for pick up.  She will bring in the other scripts.

## 2012-02-11 ENCOUNTER — Telehealth: Payer: Self-pay | Admitting: Family Medicine

## 2012-02-11 NOTE — Telephone Encounter (Signed)
Michelle Hoffman @ [phone] Caller: Thomas Hoff; PCP: Ruthe Mannan Nestor Ramp); CB#: [phone]; ; ; Call regarding Medication Problem;   [name] states she received written prescription from office for Adderall in February. States she is out of medication on 3/18. Pharmacy [facility] [phone] requesting pre authorization. States she thought Nicki from office had called in preauthorization  in February. Office note

## 2012-02-11 NOTE — Telephone Encounter (Signed)
Patient called stating that PA is needed for both doses of Adderall XR.  Called [facility] at [phone] and was advised that both PA ended on 01/07/2012 and a new PA was needed.  PA was completed over the phone approved with Robert from 01/21/2012 through 02/10/2013.  Patient was notified via message left on cell phone voicemail, copay $12.00.

## 2012-02-11 NOTE — Telephone Encounter (Signed)
[facility] Triage Call Report Triage Record Num: [id number] Operator: Frederico Hamman Patient Name: Michelle Hoffman Call Date & Time: 02/11/2012 4:23:11PM Patient Phone: [phone] PCP: Ruthe Mannan Patient Gender: Female PCP Fax : [phone] Patient DOB: 1956-06-28 Practice Name: [facility] Reason for Call: [name] @ [phone] Caller: Thomas Hoff; PCP: Ruthe Mannan Nestor Ramp); CB#: [phone]; ; ; Call regarding Medication Problem; [name] states she received written prescription from office for Adderall. States she is out of medication on 3/18. Pharmacy [facility] [phone] requesting pre authorization. States she thought Nicki from office had called in preauthorization in February. Office note Protocol(s) Used: Medication Questions - Adult Recommended Outcome per Protocol: Speak with Provider or Pharmacist within 24 hours Reason for Outcome: Requests refill of prescribed medication with valid refills; lack of medications does not put patient at clinical risk Care Advice: ~ 03/

## 2012-02-27 ENCOUNTER — Other Ambulatory Visit: Payer: Self-pay

## 2012-02-27 MED ORDER — AMPHETAMINE-DEXTROAMPHET ER 20 MG PO CP24: ORAL_CAPSULE | ORAL | Status: DC

## 2012-02-27 MED ORDER — AMPHETAMINE-DEXTROAMPHET ER 30 MG PO CP24: 30.0000 mg | ORAL_CAPSULE | ORAL | Status: DC

## 2012-02-27 NOTE — Telephone Encounter (Signed)
Pt request refill Adderall XR 20 mg and Adderall XR 30 mg. Pt last seen 01/09/12. Pt can be reached at [phone] when rxs ready for pick up.

## 2012-02-27 NOTE — Telephone Encounter (Signed)
Ok to print rx and leave on my desk to sign.

## 2012-02-28 NOTE — Telephone Encounter (Signed)
Script placed up front for pick up, advised patient. 

## 2012-04-07 ENCOUNTER — Ambulatory Visit (INDEPENDENT_AMBULATORY_CARE_PROVIDER_SITE_OTHER): Payer: BC Managed Care – PPO | Admitting: Family Medicine

## 2012-04-07 ENCOUNTER — Encounter: Payer: Self-pay | Admitting: Family Medicine

## 2012-04-07 VITALS — BP 130/84 | HR 80 | Temp 98.2°F | Wt 163.0 lb

## 2012-04-07 DIAGNOSIS — H109 Unspecified conjunctivitis: Secondary | ICD-10-CM

## 2012-04-07 MED ORDER — ERYTHROMYCIN 5 MG/GM OP OINT: TOPICAL_OINTMENT | Freq: Four times a day (QID) | OPHTHALMIC | Status: DC

## 2012-04-07 NOTE — Patient Instructions (Signed)
Conjunctivitis Conjunctivitis is commonly called "pink eye." Conjunctivitis can be caused by bacterial or viral infection, allergies, or injuries. There is usually redness of the lining of the eye, itching, discomfort, and sometimes discharge. There may be deposits of matter along the eyelids. A viral infection usually causes a watery discharge, while a bacterial infection causes a yellowish, thick discharge. Pink eye is very contagious and spreads by direct contact. You may be given antibiotic eyedrops as part of your treatment. Before using your eye medicine, remove all drainage from the eye by washing gently with warm water and cotton balls. Continue to use the medication until you have awakened 2 mornings in a row without discharge from the eye. Do not rub your eye. This increases the irritation and helps spread infection. Use separate towels from other household members. Wash your hands with soap and water before and after touching your eyes. Use cold compresses to reduce pain and sunglasses to relieve irritation from light. Do not wear contact lenses or wear eye makeup until the infection is gone. SEEK MEDICAL CARE IF:   Your symptoms are not better after 3 days of treatment.   You have increased pain or trouble seeing.   The outer eyelids become very red or swollen.  Document Released: 12/20/2004 Document Revised: 11/01/2011 Document Reviewed: 11/12/2005 Exit[employer] Patient Information 2012 [employer]. 

## 2012-04-07 NOTE — Progress Notes (Signed)
SUBJECTIVE:  56 y.o. female with burning, redness, discharge and mattering in right eye for 2 days.  No other symptoms.  No significant prior ophthalmological history. No change in visual acuity, no photophobia, no severe eye pain.  Patient Active Problem List  Diagnoses  . ACUTE SWIMMERS EAR  . HYPERTENSION  . GERD  . DERMATITIS, ALLERGIC  . LATERAL EPICONDYLITIS, LEFT  . ADHD  . Axillary lump  . Trapezius muscle spasm  . Puncture wound   Past Medical History  Diagnosis Date  . Hypertension   . GERD (gastroesophageal reflux disease)   . Allergy    Past Surgical History  Procedure Date  . Abdominal hysterectomy    History  Substance Use Topics  . Smoking status: Never Smoker   . Smokeless tobacco: Not on file  . Alcohol Use: No   Family History  Problem Relation Age of Onset  . Cervical cancer Mother 34  . Stroke Father     in 1998   Allergies  Allergen Reactions  . Meloxicam     REACTION: Tongue burns  . Sulfonamide Derivatives     REACTION: Rash   Current Outpatient Prescriptions on File Prior to Visit  Medication Sig Dispense Refill  . amphetamine-dextroamphetamine (ADDERALL XR) 20 MG 24 hr capsule Take one tablet by mouth every evening  30 capsule  0  . amphetamine-dextroamphetamine (ADDERALL XR) 30 MG 24 hr capsule Take 1 capsule (30 mg total) by mouth every morning.  30 capsule  0  . chlorpheniramine-HYDROcodone (TUSSIONEX PENNKINETIC ER) 10-8 MG/5ML LQCR Take 5 mLs by mouth at bedtime as needed.  140 mL  0  . fluticasone (FLONASE) 50 MCG/ACT nasal spray USE 2 SPRAYS IN EACH NOSTRIL DAILY  16 g  3  . ranitidine (ZANTAC) 150 MG tablet Take 150 mg by mouth 2 (two) times daily.         The PMH, PSH, Social History, Family History, Medications, and allergies have been reviewed in [facility], and have been updated if relevant.  OBJECTIVE:  BP 130/84  Pulse 80  Temp(Src) 98.2 F (36.8 C) (Oral)  Wt 163 lb (73.936 kg)  Patient appears well, vitals signs are  normal. Eyes: right eye with findings of typical conjunctivitis noted; styke in upper lid, erythema and discharge. PERRLA, no foreign body noted. No periorbital cellulitis. The corneas are clear and fundi normal. Visual acuity normal.   ASSESSMENT:  Conjunctivitis - probably bacterial  PLAN:  Antibiotic drops per order. Hygiene discussed. If other family members develop same condition, may use same medication for them if they are not known to be allergic to it. Call prn.

## 2012-04-10 ENCOUNTER — Other Ambulatory Visit: Payer: Self-pay

## 2012-04-10 NOTE — Telephone Encounter (Signed)
Pt left v/m requesting written rx Adderall XR 30 mg and Adderall XR 20 mg. Pt last seen 04/07/12. When rxs ready for pick up call [phone].

## 2012-04-11 MED ORDER — AMPHETAMINE-DEXTROAMPHET ER 30 MG PO CP24: 30.0000 mg | ORAL_CAPSULE | ORAL | Status: DC

## 2012-04-11 MED ORDER — AMPHETAMINE-DEXTROAMPHET ER 20 MG PO CP24: ORAL_CAPSULE | ORAL | Status: DC

## 2012-04-11 NOTE — Telephone Encounter (Signed)
Printed, in box

## 2012-04-11 NOTE — Telephone Encounter (Signed)
Ok to print and leave in my box. 

## 2012-05-14 ENCOUNTER — Other Ambulatory Visit: Payer: Self-pay | Admitting: Family Medicine

## 2012-05-15 NOTE — Telephone Encounter (Signed)
Is this completed?Please advise.  

## 2012-05-15 NOTE — Telephone Encounter (Signed)
Scripts were picked up

## 2012-06-02 ENCOUNTER — Other Ambulatory Visit: Payer: Self-pay | Admitting: Family Medicine

## 2012-06-02 DIAGNOSIS — Z1231 Encounter for screening mammogram for malignant neoplasm of breast: Secondary | ICD-10-CM

## 2012-06-11 ENCOUNTER — Other Ambulatory Visit: Payer: Self-pay

## 2012-06-11 MED ORDER — AMPHETAMINE-DEXTROAMPHET ER 30 MG PO CP24: 30.0000 mg | ORAL_CAPSULE | ORAL | Status: DC

## 2012-06-11 MED ORDER — AMPHETAMINE-DEXTROAMPHET ER 20 MG PO CP24: ORAL_CAPSULE | ORAL | Status: DC

## 2012-06-11 NOTE — Telephone Encounter (Signed)
Advised pt scripts are ready for pick up, scripts placed at front desk. 

## 2012-06-11 NOTE — Telephone Encounter (Signed)
Pt request rx for Adderall XR 20 mg and 30 mg. Call when ready for pick up.

## 2012-07-04 ENCOUNTER — Ambulatory Visit: Payer: BC Managed Care – PPO

## 2012-07-16 ENCOUNTER — Other Ambulatory Visit: Payer: Self-pay | Admitting: Family Medicine

## 2012-07-16 MED ORDER — AMPHETAMINE-DEXTROAMPHET ER 20 MG PO CP24: ORAL_CAPSULE | ORAL | Status: DC

## 2012-07-16 MED ORDER — AMPHETAMINE-DEXTROAMPHET ER 30 MG PO CP24: 30.0000 mg | ORAL_CAPSULE | ORAL | Status: DC

## 2012-07-16 NOTE — Telephone Encounter (Signed)
Patient calling, needs a new script for Adderal XR 20mg  and 30mg .   Please let her know when this is ready for pick up.

## 2012-07-16 NOTE — Telephone Encounter (Signed)
Pt is aware that scripts will be signed on Monday, when Dr. Dayton Martes returns to the office.  Scripts printed and placed in Dr. Elmer Sow in box.

## 2012-07-21 NOTE — Telephone Encounter (Signed)
Advised pt scripts are ready for pick up, scripts placed at front desk. 

## 2012-08-25 ENCOUNTER — Telehealth: Payer: Self-pay | Admitting: Family Medicine

## 2012-08-25 MED ORDER — AMPHETAMINE-DEXTROAMPHET ER 30 MG PO CP24: 30.0000 mg | ORAL_CAPSULE | ORAL | Status: DC

## 2012-08-25 MED ORDER — AMPHETAMINE-DEXTROAMPHET ER 20 MG PO CP24: ORAL_CAPSULE | ORAL | Status: DC

## 2012-08-25 NOTE — Telephone Encounter (Signed)
The patient called the triage line and is hoping to get a refill on Adderall.

## 2012-08-25 NOTE — Telephone Encounter (Signed)
Scripts are on your desk for signature. 

## 2012-08-26 NOTE — Telephone Encounter (Signed)
Advised patient script is ready for pick up, script placed at front desk. 

## 2012-08-26 NOTE — Telephone Encounter (Signed)
Signed and in my box. 

## 2012-09-22 ENCOUNTER — Other Ambulatory Visit: Payer: Self-pay

## 2012-09-22 NOTE — Telephone Encounter (Signed)
Pt left v/m requesting rx for Adderall XR 20 mg and 30 mg. Call when ready for pick up.  

## 2012-09-23 MED ORDER — AMPHETAMINE-DEXTROAMPHET ER 30 MG PO CP24: 30.0000 mg | ORAL_CAPSULE | ORAL | Status: DC

## 2012-09-23 MED ORDER — AMPHETAMINE-DEXTROAMPHET ER 20 MG PO CP24: ORAL_CAPSULE | ORAL | Status: DC

## 2012-09-23 NOTE — Telephone Encounter (Signed)
Left message advising pt script is ready for pick up. 

## 2012-10-20 ENCOUNTER — Ambulatory Visit: Payer: BC Managed Care – PPO | Admitting: Family Medicine

## 2012-10-21 ENCOUNTER — Ambulatory Visit: Payer: BC Managed Care – PPO | Admitting: Family Medicine

## 2012-10-30 ENCOUNTER — Emergency Department: Payer: Self-pay | Admitting: Internal Medicine

## 2012-11-01 ENCOUNTER — Observation Stay: Payer: Self-pay | Admitting: Internal Medicine

## 2012-11-01 LAB — URINALYSIS, COMPLETE
Bacteria: NONE SEEN
Blood: NEGATIVE
Ketone: NEGATIVE
Nitrite: NEGATIVE
Ph: 6 (ref 4.5–8.0)
Protein: NEGATIVE
RBC,UR: 1 /HPF (ref 0–5)
Specific Gravity: 1.013 (ref 1.003–1.030)
Squamous Epithelial: 11
WBC UR: 13 /HPF (ref 0–5)

## 2012-11-01 LAB — CBC WITH DIFFERENTIAL/PLATELET
Basophil %: 0.5 %
Eosinophil #: 0 10*3/uL (ref 0.0–0.7)
MCH: 29 pg (ref 26.0–34.0)
MCHC: 34.8 g/dL (ref 32.0–36.0)
MCV: 83 fL (ref 80–100)
Monocyte #: 1.3 x10 3/mm — ABNORMAL HIGH (ref 0.2–0.9)
Monocyte %: 7.4 %
Neutrophil %: 79.7 %
Platelet: 375 10*3/uL (ref 150–440)

## 2012-11-01 LAB — COMPREHENSIVE METABOLIC PANEL
Alkaline Phosphatase: 88 U/L (ref 50–136)
Bilirubin,Total: 0.6 mg/dL (ref 0.2–1.0)
Calcium, Total: 9.5 mg/dL (ref 8.5–10.1)
Co2: 29 mmol/L (ref 21–32)
Creatinine: 0.63 mg/dL (ref 0.60–1.30)
EGFR (African American): >60
EGFR (Non-African Amer.): >60
Glucose: 99 mg/dL (ref 65–99)
Osmolality: 268 (ref 275–301)
Sodium: 134 mmol/L — ABNORMAL LOW (ref 136–145)
Total Protein: 8.1 g/dL (ref 6.4–8.2)

## 2012-11-10 ENCOUNTER — Encounter: Payer: Self-pay | Admitting: Family Medicine

## 2012-11-10 ENCOUNTER — Ambulatory Visit (INDEPENDENT_AMBULATORY_CARE_PROVIDER_SITE_OTHER): Payer: BC Managed Care – PPO | Admitting: Family Medicine

## 2012-11-10 VITALS — BP 110/78 | HR 84 | Temp 97.9°F | Wt 164.0 lb

## 2012-11-10 DIAGNOSIS — J189 Pneumonia, unspecified organism: Secondary | ICD-10-CM

## 2012-11-10 NOTE — Progress Notes (Signed)
Subjective:    Patient ID: Michelle Hoffman, female    DOB: [birth year], 56 y.o.   MRN: [id number]  HPI  56 yo here for hospital follow up.  Notes reviewed- admitted to [facility] 12/7-12/06/2012.  Presented to [facility] with complaint of "not feeling well and generalized body aches."  WBC elevated to 17, CXR- ? Bilateral opacities, CT of chest- heterogenous opacities in bilateral lung bases- PNA vs atelectasis. Since WBC count elevated- treated for CAP with levaquin.  Remained afebrile with normal O2 sat throughout admission according to discharge summary.  She feel much better.  Still has a little dry cough and some right sided rib pain with deep inspirations but this is improving as well.  Patient Active Problem List  Diagnosis  . ACUTE SWIMMERS EAR  . HYPERTENSION  . GERD  . DERMATITIS, ALLERGIC  . LATERAL EPICONDYLITIS, LEFT  . ADHD  . Axillary lump  . Trapezius muscle spasm  . Puncture wound  . CAP (community acquired pneumonia)   Past Medical History  Diagnosis Date  . Hypertension   . GERD (gastroesophageal reflux disease)   . Allergy    Past Surgical History  Procedure Date  . Abdominal hysterectomy    History  Substance Use Topics  . Smoking status: Never Smoker   . Smokeless tobacco: Not on file  . Alcohol Use: No   Family History  Problem Relation Age of Onset  . Cervical cancer Mother 12  . Stroke Father     in 1998   Allergies  Allergen Reactions  . Meloxicam     REACTION: Tongue burns  . Sulfonamide Derivatives     REACTION: Rash   Current Outpatient Prescriptions on File Prior to Visit  Medication Sig Dispense Refill  . amphetamine-dextroamphetamine (ADDERALL XR) 20 MG 24 hr capsule Take one tablet by mouth every evening  30 capsule  0  . amphetamine-dextroamphetamine (ADDERALL XR) 30 MG 24 hr capsule Take 1 capsule (30 mg total) by mouth every morning.  30 capsule  0  . fluticasone (FLONASE) 50 MCG/ACT nasal spray USE 2 SPRAYS IN EACH NOSTRIL DAILY  16  g  3  . ranitidine (ZANTAC) 150 MG tablet TAKE 1 TABLET BY MOUTH TWICE A DAY AS NEEDED FOR REFLUX  60 tablet  6   The PMH, PSH, Social History, Family History, Medications, and allergies have been reviewed in [facility], and have been updated if relevant.    Review of Systems See HPI     Objective:   Physical Exam BP 110/78  Pulse 84  Temp 97.9 F (36.6 C)  Wt 164 lb (74.39 kg)  General:  Well-developed,well-nourished,in no acute distress; alert,appropriate and cooperative throughout examination Head:  normocephalic and atraumatic.   Eyes:  vision grossly intact, pupils equal, pupils round, and pupils reactive to light.   Ears:  R ear normal and L ear normal.   Nose:  no external deformity.   Lungs:  Normal respiratory effort, chest expands symmetrically. Lungs are clear to auscultation, no crackles or wheezes. Heart:  Normal rate and regular rhythm. S1 and S2 normal without gallop, murmur, click, rub or other extra sounds. Msk:  No deformity or scoliosis noted of thoracic or lumbar spine.   Extremities:  No clubbing, cyanosis, edema, or deformity noted with normal full range of motion of all joints.   Neurologic:  alert & oriented X3 and gait normal.   Skin:  Intact without suspicious lesions or rashes Cervical Nodes:  No lymphadenopathy noted Psych:  Cognition and judgment appear intact. Alert and cooperative with normal attention span and concentration. No apparent delusions, illusions, hallucinations     Assessment & Plan:   1. CAP (community acquired pneumonia)    New- s/p abx treatment.  Asymptomatic and doing well.  No further work up unless symptoms deteriorated or do not continue to improve. The patient indicates understanding of these issues and agrees with the plan.

## 2012-12-01 ENCOUNTER — Other Ambulatory Visit: Payer: Self-pay

## 2012-12-01 MED ORDER — AMPHETAMINE-DEXTROAMPHET ER 30 MG PO CP24: 30.0000 mg | ORAL_CAPSULE | ORAL | Status: DC

## 2012-12-01 MED ORDER — AMPHETAMINE-DEXTROAMPHET ER 20 MG PO CP24: ORAL_CAPSULE | ORAL | Status: DC

## 2012-12-01 NOTE — Telephone Encounter (Signed)
Left message on home voice mail that scripts are ready for pick up.

## 2012-12-01 NOTE — Telephone Encounter (Signed)
Pt left v/m requesting rx adderall XR 20 and 30 mg. Call when ready for pick up.

## 2013-01-06 ENCOUNTER — Other Ambulatory Visit: Payer: Self-pay | Admitting: Family Medicine

## 2013-01-20 ENCOUNTER — Ambulatory Visit (INDEPENDENT_AMBULATORY_CARE_PROVIDER_SITE_OTHER)
Admission: RE | Admit: 2013-01-20 | Discharge: 2013-01-20 | Disposition: A | Discharge status: Home / Self Care | Payer: BC Managed Care – PPO | Source: Ambulatory Visit | Attending: Family Medicine | Admitting: Family Medicine

## 2013-01-20 ENCOUNTER — Encounter: Payer: Self-pay | Admitting: Family Medicine

## 2013-01-20 ENCOUNTER — Ambulatory Visit (INDEPENDENT_AMBULATORY_CARE_PROVIDER_SITE_OTHER): Payer: BC Managed Care – PPO | Admitting: Family Medicine

## 2013-01-20 VITALS — BP 120/70 | HR 84 | Temp 97.7°F | Wt 167.0 lb

## 2013-01-20 DIAGNOSIS — M25512 Pain in left shoulder: Secondary | ICD-10-CM | POA: Insufficient documentation

## 2013-01-20 DIAGNOSIS — M25519 Pain in unspecified shoulder: Secondary | ICD-10-CM

## 2013-01-20 MED ORDER — AMPHETAMINE-DEXTROAMPHET ER 20 MG PO CP24: ORAL_CAPSULE | ORAL | Status: DC

## 2013-01-20 MED ORDER — AMPHETAMINE-DEXTROAMPHET ER 30 MG PO CP24: 30.0000 mg | ORAL_CAPSULE | ORAL | Status: DC

## 2013-01-20 NOTE — Patient Instructions (Addendum)
On your way out, schedule an appointment with Dr. Patsy Lager for your shoulder.  Continue taking Tylenol sinus, call later this week if no improvement.

## 2013-01-20 NOTE — Progress Notes (Signed)
SUBJECTIVE: Michelle Hoffman is a 57 y.o. female who complains of left shoulder pain x 1 month without known injury.   Immediate symptoms: immediate pain. Symptoms have been intermittent since that time. Prior history of related problems: no prior problems with this area in the past.  She has mostly full range of motion of her left shoulder without pain but has immediate pain when she pushes something in front of her body or if she reaches up to put something on her face.  Can reach in cabinets or over head without pain.  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  Patient Active Problem List  Diagnosis  . HYPERTENSION  . GERD  . LATERAL EPICONDYLITIS, LEFT  . Left shoulder pain   Past Medical History  Diagnosis Date  . Hypertension   . GERD (gastroesophageal reflux disease)   . Allergy    Past Surgical History  Procedure Laterality Date  . Abdominal hysterectomy     History  Substance Use Topics  . Smoking status: Never Smoker   . Smokeless tobacco: Not on file  . Alcohol Use: No   Family History  Problem Relation Age of Onset  . Cervical cancer Mother 61  . Stroke Father     in 1998   Allergies  Allergen Reactions  . Meloxicam     REACTION: Tongue burns  . Sulfonamide Derivatives     REACTION: Rash   Current Outpatient Prescriptions on File Prior to Visit  Medication Sig Dispense Refill  . fluticasone (FLONASE) 50 MCG/ACT nasal spray USE 2 SPRAYS EACH NOSTRIL EVERY DAY  16 g  5  . ranitidine (ZANTAC) 150 MG tablet TAKE 1 TABLET BY MOUTH TWICE A DAY AS NEEDED FOR REFLUX  60 tablet  6   No current facility-administered medications on file prior to visit.   The PMH, PSH, Social History, Family History, Medications, and allergies have been reviewed in [facility], and have been updated if relevant.   OBJECTIVE: BP 120/70  Pulse 84  Temp(Src) 97.7 F (36.5 C)  Wt 167 lb (75.751 kg)  BMI 32.62 kg/m2  Appearance: alert, well appearing, and in no distress. Shoulder exam:  positive impingement signs, no swelling, neg arch sign. X-ray: ordered, but results not yet available.  ASSESSMENT: Shoulder tendon injury vs OA  PLAN: rest the injured area as much as practical, X-Ray ordered, follow up with Dr. Patsy Lager for work up and steroid injections. See orders for this visit as documented in the electronic medical record.

## 2013-01-23 ENCOUNTER — Telehealth: Payer: Self-pay | Admitting: *Deleted

## 2013-01-23 MED ORDER — AZITHROMYCIN 250 MG PO TABS: ORAL_TABLET | ORAL | Status: DC

## 2013-01-23 MED ORDER — FLUCONAZOLE 150 MG PO TABS: 150.0000 mg | ORAL_TABLET | Freq: Once | ORAL | Status: DC

## 2013-01-23 NOTE — Telephone Encounter (Signed)
Pt states she's not any better than she was when she came in earlier this week.  Her ears are worse.  States you had told her to call back if not better and you would send something in for her.  She's also requesting something for yeast if antibiotic is called in.  [facility].  Advised her that you are out of the office today and to check with pharmacy over the weekend if she doesn't here back from me today.

## 2013-01-23 NOTE — Telephone Encounter (Signed)
Left message on voice mail advising patient meds have been sent to pharmacy.

## 2013-01-23 NOTE — Telephone Encounter (Signed)
Zpack and diflucan sent to her pharmacy.

## 2013-01-26 ENCOUNTER — Ambulatory Visit: Payer: BC Managed Care – PPO | Admitting: Family Medicine

## 2013-02-12 ENCOUNTER — Encounter: Payer: Self-pay | Admitting: Family Medicine

## 2013-02-12 ENCOUNTER — Encounter: Payer: BC Managed Care – PPO | Admitting: Family Medicine

## 2013-02-12 DIAGNOSIS — Z0289 Encounter for other administrative examinations: Secondary | ICD-10-CM

## 2013-02-12 NOTE — Progress Notes (Signed)
This encounter was created in error - please disregard.

## 2013-02-21 ENCOUNTER — Ambulatory Visit: Payer: Self-pay | Admitting: Internal Medicine

## 2013-02-26 ENCOUNTER — Encounter: Payer: BC Managed Care – PPO | Admitting: Family Medicine

## 2013-02-26 ENCOUNTER — Encounter: Payer: Self-pay | Admitting: Family Medicine

## 2013-02-26 DIAGNOSIS — Z0289 Encounter for other administrative examinations: Secondary | ICD-10-CM

## 2013-02-27 ENCOUNTER — Encounter: Payer: Self-pay | Admitting: Family Medicine

## 2013-02-27 ENCOUNTER — Ambulatory Visit (INDEPENDENT_AMBULATORY_CARE_PROVIDER_SITE_OTHER): Payer: BC Managed Care – PPO | Admitting: Family Medicine

## 2013-02-27 VITALS — BP 120/80 | HR 80 | Temp 97.9°F | Wt 163.0 lb

## 2013-02-27 DIAGNOSIS — K760 Fatty (change of) liver, not elsewhere classified: Secondary | ICD-10-CM

## 2013-02-27 DIAGNOSIS — K7689 Other specified diseases of liver: Secondary | ICD-10-CM

## 2013-02-27 LAB — COMPREHENSIVE METABOLIC PANEL
AST: 18 U/L (ref 0–37)
Albumin: 4.5 g/dL (ref 3.5–5.2)
Alkaline Phosphatase: 72 U/L (ref 39–117)
BUN: 17 mg/dL (ref 6–23)
Glucose, Bld: 91 mg/dL (ref 70–99)
Potassium: 3.9 mEq/L (ref 3.5–5.3)
Sodium: 143 mEq/L (ref 135–145)
Total Bilirubin: 0.3 mg/dL (ref 0.3–1.2)

## 2013-02-27 MED ORDER — AMPHETAMINE-DEXTROAMPHET ER 20 MG PO CP24: ORAL_CAPSULE | ORAL | Status: DC

## 2013-02-27 MED ORDER — AMPHETAMINE-DEXTROAMPHET ER 30 MG PO CP24: 30.0000 mg | ORAL_CAPSULE | ORAL | Status: DC

## 2013-02-27 NOTE — Progress Notes (Signed)
  Subjective:    Patient ID: Michelle Hoffman, female    DOB: July 07, 1956, 57 y.o.   MRN: [id number]  HPI  57 yo female here to discuss [place] results.  Went to UC on 3/29 for epigastric pain that was intermittent, not aggrevated or alleviated by food, and was not associated with n/v/d or fever.  Per pt, lab results normal (we do not have these).  Ultrasound of abdomen was done- she brings report in with her today.  Showed fatty infiltration of liver and pancreas.  Tdap UTD.  Prior h/o of Hep A.  Does not drink ETOH.  Abdominal pain has resolved.  Patient Active Problem List  Diagnosis  . HYPERTENSION  . GERD  . LATERAL EPICONDYLITIS, LEFT  . Left shoulder pain  . Fatty liver   Past Medical History  Diagnosis Date  . Hypertension   . GERD (gastroesophageal reflux disease)   . Allergy    Past Surgical History  Procedure Laterality Date  . Abdominal hysterectomy     History  Substance Use Topics  . Smoking status: Never Smoker   . Smokeless tobacco: Not on file  . Alcohol Use: No   Family History  Problem Relation Age of Onset  . Cervical cancer Mother 2  . Stroke Father     in 1998   Allergies  Allergen Reactions  . Meloxicam     REACTION: Tongue burns  . Sulfonamide Derivatives     REACTION: Rash   Current Outpatient Prescriptions on File Prior to Visit  Medication Sig Dispense Refill  . fluticasone (FLONASE) 50 MCG/ACT nasal spray USE 2 SPRAYS EACH NOSTRIL EVERY DAY  16 g  5  . ranitidine (ZANTAC) 150 MG tablet TAKE 1 TABLET BY MOUTH TWICE A DAY AS NEEDED FOR REFLUX  60 tablet  6   No current facility-administered medications on file prior to visit.   The PMH, PSH, Social History, Family History, Medications, and allergies have been reviewed in [facility], and have been updated if relevant.   Review of Systems As per HPI    Objective:   Physical Exam  Constitutional: She appears well-developed and well-nourished. No distress.  HENT:  Head:  Normocephalic.  Neck: Normal range of motion.  Abdominal: Soft. She exhibits no distension, no fluid wave, no ascites and no mass. There is no hepatosplenomegaly. There is no tenderness. There is no rigidity and no guarding.  Does have abdominal fat distribution  Skin: Skin is warm and dry.  Psychiatric: She has a normal mood and affect.   BP 120/80  Pulse 80  Temp(Src) 97.9 F (36.6 C)  Wt 163 lb (73.936 kg)  BMI 31.83 kg/m2    Assessment & Plan:  1. Fatty liver New with ? Fatty pancreas.  Risk stratify with labs.  Discussed normal course and tx of fatty liver.  Discussed importance of diet and exercise. CT of abdomen and pelvis for further evaluation given findings in pancreas. The patient indicates understanding of these issues and agrees with the plan.   - Comprehensive metabolic panel - Lipase - Hepatitis A antibody, total - Hepatitis C antibody - CT Abdomen Pelvis W Contrast; Future

## 2013-02-27 NOTE — Progress Notes (Signed)
This encounter was created in error - please disregard.

## 2013-02-27 NOTE — Patient Instructions (Addendum)
Good to see you. We are getting some blood work today. I will call you next week with those results. Please stop by to see Michelle Hoffman on your way out to set up your CT scan.

## 2013-03-03 ENCOUNTER — Ambulatory Visit (INDEPENDENT_AMBULATORY_CARE_PROVIDER_SITE_OTHER)
Admission: RE | Admit: 2013-03-03 | Discharge: 2013-03-03 | Disposition: A | Discharge status: Home / Self Care | Payer: BC Managed Care – PPO | Source: Ambulatory Visit | Attending: Family Medicine | Admitting: Family Medicine

## 2013-03-03 DIAGNOSIS — K7689 Other specified diseases of liver: Secondary | ICD-10-CM

## 2013-03-03 DIAGNOSIS — K760 Fatty (change of) liver, not elsewhere classified: Secondary | ICD-10-CM

## 2013-03-03 MED ORDER — IOHEXOL 300 MG/ML  SOLN
100.0000 mL | Freq: Once | INTRAMUSCULAR | Status: AC | PRN
  Administered 2013-03-03: 100 mL via INTRAVENOUS

## 2013-04-22 ENCOUNTER — Other Ambulatory Visit: Payer: Self-pay

## 2013-04-22 MED ORDER — AMPHETAMINE-DEXTROAMPHET ER 20 MG PO CP24: ORAL_CAPSULE | ORAL | Status: DC

## 2013-04-22 MED ORDER — AMPHETAMINE-DEXTROAMPHET ER 30 MG PO CP24: 30.0000 mg | ORAL_CAPSULE | ORAL | Status: DC

## 2013-04-22 NOTE — Telephone Encounter (Signed)
Pt left v/m requesting rx adderall XR 30 mg & 20 mg.call when ready for pick up.

## 2013-04-22 NOTE — Telephone Encounter (Signed)
Scripts are on your desk for signature. 

## 2013-04-23 NOTE — Telephone Encounter (Signed)
Left message advising patient scripts are ready for pick up. 

## 2013-04-27 ENCOUNTER — Encounter: Payer: Self-pay | Admitting: Family Medicine

## 2013-05-11 ENCOUNTER — Encounter: Payer: Self-pay | Admitting: Family Medicine

## 2013-05-19 ENCOUNTER — Ambulatory Visit: Payer: BC Managed Care – PPO | Admitting: Family Medicine

## 2013-05-26 ENCOUNTER — Other Ambulatory Visit: Payer: Self-pay

## 2013-05-26 NOTE — Telephone Encounter (Signed)
Ok to print out and leave on my desk for signature. 

## 2013-05-26 NOTE — Telephone Encounter (Signed)
Pt left v/m requesting rxs for adderal XR 20 mg and 30 mg. Call when ready for pick up.

## 2013-05-27 NOTE — Telephone Encounter (Signed)
Pt called back;is out of adderall XR 30 mg and needs to pick up on 05/28/13. Advised pt CMA will cb with status of rx on 05/29/13.

## 2013-05-28 MED ORDER — AMPHETAMINE-DEXTROAMPHET ER 30 MG PO CP24: 30.0000 mg | ORAL_CAPSULE | ORAL | Status: DC

## 2013-05-28 MED ORDER — AMPHETAMINE-DEXTROAMPHET ER 20 MG PO CP24: ORAL_CAPSULE | ORAL | Status: DC

## 2013-05-28 NOTE — Telephone Encounter (Signed)
Advised mother script is ready for pick up at front desk. 

## 2013-06-18 ENCOUNTER — Ambulatory Visit (INDEPENDENT_AMBULATORY_CARE_PROVIDER_SITE_OTHER): Payer: BC Managed Care – PPO | Admitting: Family Medicine

## 2013-06-18 ENCOUNTER — Encounter: Payer: Self-pay | Admitting: Family Medicine

## 2013-06-18 ENCOUNTER — Telehealth: Payer: Self-pay

## 2013-06-18 VITALS — BP 112/80 | HR 100 | Temp 97.6°F | Ht 60.0 in | Wt 175.0 lb

## 2013-06-18 DIAGNOSIS — H109 Unspecified conjunctivitis: Secondary | ICD-10-CM

## 2013-06-18 MED ORDER — ERYTHROMYCIN 5 MG/GM OP OINT: TOPICAL_OINTMENT | Freq: Four times a day (QID) | OPHTHALMIC | Status: AC

## 2013-06-18 MED ORDER — ERYTHROMYCIN 5 MG/GM OP OINT: TOPICAL_OINTMENT | Freq: Four times a day (QID) | OPHTHALMIC | Status: DC

## 2013-06-18 NOTE — Telephone Encounter (Signed)
Yes ok to go.

## 2013-06-18 NOTE — Patient Instructions (Addendum)
Good to see you. Please use erythromycin ointment as directed.  Continue warm compresses.

## 2013-06-18 NOTE — Progress Notes (Signed)
SUBJECTIVE:  57 y.o. female with burning, redness, discharge and mattering in right eye for 3 days.  No other symptoms.  No significant prior ophthalmological history. No change in visual acuity, no photophobia, no severe eye pain.  Patient Active Problem List   Diagnosis Date Noted  . Fatty liver 02/27/2013  . Left shoulder pain 01/20/2013  . LATERAL EPICONDYLITIS, LEFT 07/19/2010  . HYPERTENSION 11/08/2009  . GERD 11/08/2009   Past Medical History  Diagnosis Date  . Hypertension   . GERD (gastroesophageal reflux disease)   . Allergy    Past Surgical History  Procedure Laterality Date  . Abdominal hysterectomy     History  Substance Use Topics  . Smoking status: Never Smoker   . Smokeless tobacco: Not on file  . Alcohol Use: No   Family History  Problem Relation Age of Onset  . Cervical cancer Mother 48  . Stroke Father     in 1998   Allergies  Allergen Reactions  . Meloxicam     REACTION: Tongue burns  . Sulfonamide Derivatives     REACTION: Rash   Current Outpatient Prescriptions on File Prior to Visit  Medication Sig Dispense Refill  . amphetamine-dextroamphetamine (ADDERALL XR) 20 MG 24 hr capsule Take one tablet by mouth every evening  30 capsule  0  . amphetamine-dextroamphetamine (ADDERALL XR) 30 MG 24 hr capsule Take 1 capsule (30 mg total) by mouth every morning.  30 capsule  0  . fluticasone (FLONASE) 50 MCG/ACT nasal spray USE 2 SPRAYS EACH NOSTRIL EVERY DAY  16 g  5  . ranitidine (ZANTAC) 150 MG tablet TAKE 1 TABLET BY MOUTH TWICE A DAY AS NEEDED FOR REFLUX  60 tablet  6   No current facility-administered medications on file prior to visit.   The PMH, PSH, Social History, Family History, Medications, and allergies have been reviewed in [facility], and have been updated if relevant.  OBJECTIVE:  BP 112/80  Pulse 100  Temp(Src) 97.6 F (36.4 C)  Ht 5' (1.524 m)  Wt 175 lb (79.379 kg)  BMI 34.18 kg/m2  Patient appears well, vitals signs are normal. Eyes:  right eye with findings of typical conjunctivitis noted; erythema and discharge. PERRLA, no foreign body noted. No periorbital cellulitis. The corneas are clear and fundi normal. Visual acuity normal.   ASSESSMENT:  Conjunctivitis - probably bacterial  PLAN:  Antibiotic drops per order. Hygiene discussed. If other family members develop same condition, may use same medication for them if they are not known to be allergic to it. Call prn.

## 2013-06-18 NOTE — Telephone Encounter (Signed)
Pt was seen on 06/18/13; pt is to go with grandchildren on 06/20/13 to [facility]; pt wants to make sure OK that she goes.Please advise.

## 2013-06-18 NOTE — Telephone Encounter (Signed)
Advised patient

## 2013-06-29 ENCOUNTER — Telehealth: Payer: Self-pay

## 2013-06-29 NOTE — Telephone Encounter (Signed)
Does she have an eye doctor?  If so, I would like for her to evaluated.  If she does not have one, we can give her some names of local optometrists.

## 2013-06-29 NOTE — Telephone Encounter (Signed)
Pt seen 06/18/13; bump on bottom of rt eye never went away, today rt eye was mattering this morning, ry eye is not red or draining now. Pt stopped med after 10 days on 06/27/13. Pt still has  Erythromycin eye ointment; should pt continue eye ointment longer or what to do.Please advise. [facility]. Pt request cb.

## 2013-06-29 NOTE — Telephone Encounter (Signed)
Spoke with patient.  She does have an eye doctor.  I advised her that you would like for her to be seen by her eye doctor.  She said she would call them for appt.

## 2013-07-10 ENCOUNTER — Other Ambulatory Visit: Payer: Self-pay

## 2013-07-10 ENCOUNTER — Telehealth: Payer: Self-pay

## 2013-07-10 DIAGNOSIS — M1990 Unspecified osteoarthritis, unspecified site: Secondary | ICD-10-CM

## 2013-07-10 MED ORDER — AMPHETAMINE-DEXTROAMPHET ER 20 MG PO CP24: ORAL_CAPSULE | ORAL | Status: DC

## 2013-07-10 MED ORDER — AMPHETAMINE-DEXTROAMPHET ER 30 MG PO CP24: 30.0000 mg | ORAL_CAPSULE | ORAL | Status: DC

## 2013-07-10 NOTE — Telephone Encounter (Signed)
Pt left v/m requesting referral to Dr Olevia Bowens, rheumatology specialist at [facility] contact # [phone]. Pt wants referral for arthritis pain. Pt said she has not been diagnosed with rheumatoid arthritis. Pt fell in 2005 and had back pain was seen at walk in at [facility] town and was told had arthritis in her back.  2 of pts toes have knots and thinks is rheumatoid arthritis. Pt said has discussed with Dr Dayton Martes previously about rheumatology referral. Please advise.

## 2013-07-10 NOTE — Telephone Encounter (Signed)
I could refer her for osteo arthritis but it would be prudent to due rheumatoid labs here first.  Referral placed.  Please let me know if she agrees to lab work.

## 2013-07-10 NOTE — Telephone Encounter (Signed)
Informed pt that RX ready at front office.

## 2013-07-10 NOTE — Telephone Encounter (Signed)
Pt left v/m requesting rx adderall xr 20 mg and 30 mg. Call when ready for pick up.

## 2013-07-13 ENCOUNTER — Other Ambulatory Visit (INDEPENDENT_AMBULATORY_CARE_PROVIDER_SITE_OTHER): Payer: BC Managed Care – PPO

## 2013-07-13 ENCOUNTER — Other Ambulatory Visit: Payer: Self-pay | Admitting: Family Medicine

## 2013-07-13 DIAGNOSIS — M2550 Pain in unspecified joint: Secondary | ICD-10-CM

## 2013-07-13 LAB — SEDIMENTATION RATE: Sed Rate: 10 mm/hr (ref 0–22)

## 2013-07-13 NOTE — Telephone Encounter (Signed)
Left message asking patient to call back

## 2013-07-13 NOTE — Telephone Encounter (Signed)
Pt walked in, advised her that referral has been placed but that she should have labs prior. Pt agreed, will have labs today.

## 2013-07-14 ENCOUNTER — Telehealth: Payer: Self-pay

## 2013-07-14 NOTE — Telephone Encounter (Signed)
Pt left v/m, unsure of what pt was requesting. Left v/m for pt to cb.

## 2013-07-15 ENCOUNTER — Other Ambulatory Visit: Payer: Self-pay | Admitting: Family Medicine

## 2013-07-15 DIAGNOSIS — R768 Other specified abnormal immunological findings in serum: Secondary | ICD-10-CM

## 2013-07-18 ENCOUNTER — Other Ambulatory Visit: Payer: Self-pay | Admitting: Family Medicine

## 2013-07-22 NOTE — Telephone Encounter (Signed)
Left v/m for pt to cb. Spoke with Gery Pray and he was not sure what pt needed but when he saw pt he would advise her to call office.

## 2013-07-22 NOTE — Telephone Encounter (Signed)
Pt called back and said she was calling about rheumatology referral but that has been taken care of and pt does not need anything else at this time.

## 2013-08-10 ENCOUNTER — Other Ambulatory Visit: Payer: Self-pay

## 2013-08-10 MED ORDER — AMPHETAMINE-DEXTROAMPHET ER 30 MG PO CP24: 30.0000 mg | ORAL_CAPSULE | ORAL | Status: DC

## 2013-08-10 MED ORDER — AMPHETAMINE-DEXTROAMPHET ER 20 MG PO CP24: ORAL_CAPSULE | ORAL | Status: DC

## 2013-08-10 NOTE — Telephone Encounter (Signed)
Advised patient scripts are ready for pick up, placed at front desk. 

## 2013-08-10 NOTE — Telephone Encounter (Signed)
Pt request rx adderall xr 20 mg and 30 mg. Call when ready for pick up.

## 2013-09-21 ENCOUNTER — Other Ambulatory Visit: Payer: Self-pay

## 2013-09-21 NOTE — Telephone Encounter (Signed)
Pt request rx adderall XR 20 mg and 30 mg. Call when ready for pick up.

## 2013-09-22 MED ORDER — AMPHETAMINE-DEXTROAMPHET ER 30 MG PO CP24: 30.0000 mg | ORAL_CAPSULE | ORAL | Status: DC

## 2013-09-22 MED ORDER — AMPHETAMINE-DEXTROAMPHET ER 20 MG PO CP24: ORAL_CAPSULE | ORAL | Status: DC

## 2013-09-22 NOTE — Telephone Encounter (Signed)
Last office visit 06/18/13. Is it okay to refill medication?

## 2013-09-22 NOTE — Telephone Encounter (Signed)
Left message on machine that rx is ready for pick-up, and it will be at our front desk.  

## 2013-11-12 ENCOUNTER — Other Ambulatory Visit: Payer: Self-pay

## 2013-11-12 NOTE — Telephone Encounter (Signed)
Pt left v/m requesting rx adderall 20 mg and 30 mg. Call when ready for pick up. 

## 2013-11-13 MED ORDER — AMPHETAMINE-DEXTROAMPHET ER 20 MG PO CP24: ORAL_CAPSULE | ORAL | Status: DC

## 2013-11-13 MED ORDER — AMPHETAMINE-DEXTROAMPHET ER 30 MG PO CP24: 30.0000 mg | ORAL_CAPSULE | ORAL | Status: DC

## 2013-11-13 NOTE — Telephone Encounter (Signed)
Spoke to pt and informed her that Rx is available for pick up at the front desk. Informed her that a gov't issued photo id is required for pickup

## 2013-12-02 ENCOUNTER — Other Ambulatory Visit: Payer: Self-pay | Admitting: Family Medicine

## 2013-12-18 ENCOUNTER — Encounter: Payer: Self-pay | Admitting: Family Medicine

## 2013-12-18 ENCOUNTER — Ambulatory Visit (INDEPENDENT_AMBULATORY_CARE_PROVIDER_SITE_OTHER): Payer: BC Managed Care – PPO | Admitting: Family Medicine

## 2013-12-18 VITALS — BP 122/68 | HR 94 | Temp 97.8°F | Ht 60.0 in | Wt 184.2 lb

## 2013-12-18 DIAGNOSIS — H9209 Otalgia, unspecified ear: Secondary | ICD-10-CM

## 2013-12-18 DIAGNOSIS — H9201 Otalgia, right ear: Secondary | ICD-10-CM | POA: Insufficient documentation

## 2013-12-18 DIAGNOSIS — J019 Acute sinusitis, unspecified: Secondary | ICD-10-CM

## 2013-12-18 MED ORDER — AMPHETAMINE-DEXTROAMPHET ER 30 MG PO CP24: 30.0000 mg | ORAL_CAPSULE | ORAL | Status: DC

## 2013-12-18 MED ORDER — AMPHETAMINE-DEXTROAMPHET ER 20 MG PO CP24: ORAL_CAPSULE | ORAL | Status: DC

## 2013-12-18 NOTE — Patient Instructions (Signed)
Good to see you. Please use Afrin as directed for 2-3 days, continue flonase.  Keep me updated with your symptoms.

## 2013-12-18 NOTE — Assessment & Plan Note (Signed)
Secondary to cerumen impaction. Ceruminosis is noted.  Wax is removed by syringing and manual debridement. Instructions for home care to prevent wax buildup are given.

## 2013-12-18 NOTE — Progress Notes (Signed)
   Subjective:    Patient ID: Michelle Hoffman, female    DOB: 06-06-1956, 58 y.o.   MRN: [id number]  HPI  Very pleasant 58 yo female here for persistent sinus infection.  Went to walk in clinic 9 days ago.  Given Augmentin for sinusitis and has been using flonase. Afebrile and feels a little better but right ear is hurting and her "nose still feels clogged."  No cough, no wheezing.  Daughter has the flu.  Patient Active Problem List   Diagnosis Date Noted  . Acute sinus infection 12/18/2013  . Right ear pain 12/18/2013  . Fatty liver 02/27/2013  . Left shoulder pain 01/20/2013  . LATERAL EPICONDYLITIS, LEFT 07/19/2010  . HYPERTENSION 11/08/2009  . GERD 11/08/2009   Past Medical History  Diagnosis Date  . Hypertension   . GERD (gastroesophageal reflux disease)   . Allergy    Past Surgical History  Procedure Laterality Date  . Abdominal hysterectomy     History  Substance Use Topics  . Smoking status: Never Smoker   . Smokeless tobacco: Not on file  . Alcohol Use: No   Family History  Problem Relation Age of Onset  . Cervical cancer Mother [id number]  . Stroke Father     in 1998   Allergies  Allergen Reactions  . Meloxicam     REACTION: Tongue burns  . Sulfonamide Derivatives     REACTION: Rash   Current Outpatient Prescriptions on File Prior to Visit  Medication Sig Dispense Refill  . amphetamine-dextroamphetamine (ADDERALL XR) 20 MG 24 hr capsule Take one tablet by mouth every evening  30 capsule  0  . amphetamine-dextroamphetamine (ADDERALL XR) 30 MG 24 hr capsule Take 1 capsule (30 mg total) by mouth every morning.  30 capsule  0  . fluticasone (FLONASE) 50 MCG/ACT nasal spray USE 2 SPRAYS EACH NOSTRIL EVERY DAY  16 g  5  . ranitidine (ZANTAC) 150 MG tablet TAKE 1 TABLET BY MOUTH TWICE A DAY AS NEEDED FOR REFLUX  60 tablet  5   No current facility-administered medications on file prior to visit.   The PMH, PSH, Social History, Family History, Medications, and  allergies have been reviewed [facility], and have been updated if relevant.   Review of Systems  Constitutional: Negative for chills, diaphoresis and appetite change.  Gastrointestinal: Negative for nausea and vomiting.  All other systems reviewed and are negative.       Objective:   Physical Exam  Vitals reviewed. Constitutional: She appears well-developed and well-nourished.  HENT:  Right cerumen impaction Sinuses NTTP   Cardiovascular: Normal rate and regular rhythm.   Pulmonary/Chest: Effort normal and breath sounds normal.  Abdominal: Soft.  Skin: Skin is warm, dry and intact.  Psychiatric: She has a normal mood and affect. Her speech is normal and behavior is normal. Thought content normal.          Assessment & Plan:

## 2013-12-18 NOTE — Assessment & Plan Note (Signed)
Improving and does not need another abx- discussed this with pt. Add Afrin for a few days at the most. Continue flonase. Call or return to clinic prn if these symptoms worsen or fail to improve as anticipated.

## 2013-12-18 NOTE — Progress Notes (Signed)
Pre-visit discussion using our clinic review tool. No additional management support is needed unless otherwise documented below in the visit note.  

## 2014-01-18 ENCOUNTER — Ambulatory Visit: Payer: BC Managed Care – PPO | Admitting: Family Medicine

## 2014-01-21 ENCOUNTER — Ambulatory Visit: Payer: BC Managed Care – PPO | Admitting: Family Medicine

## 2014-01-22 ENCOUNTER — Ambulatory Visit (INDEPENDENT_AMBULATORY_CARE_PROVIDER_SITE_OTHER): Payer: BC Managed Care – PPO | Admitting: Family Medicine

## 2014-01-22 ENCOUNTER — Ambulatory Visit (INDEPENDENT_AMBULATORY_CARE_PROVIDER_SITE_OTHER)
Admission: RE | Admit: 2014-01-22 | Discharge: 2014-01-22 | Disposition: A | Discharge status: Home / Self Care | Payer: BC Managed Care – PPO | Source: Ambulatory Visit | Attending: Family Medicine | Admitting: Family Medicine

## 2014-01-22 ENCOUNTER — Encounter: Payer: Self-pay | Admitting: Family Medicine

## 2014-01-22 VITALS — BP 138/82 | HR 87 | Temp 98.0°F | Wt 188.5 lb

## 2014-01-22 DIAGNOSIS — M542 Cervicalgia: Secondary | ICD-10-CM

## 2014-01-22 MED ORDER — AMPHETAMINE-DEXTROAMPHET ER 30 MG PO CP24: 30.0000 mg | ORAL_CAPSULE | ORAL | Status: DC

## 2014-01-22 MED ORDER — AMPHETAMINE-DEXTROAMPHET ER 20 MG PO CP24: ORAL_CAPSULE | ORAL | Status: DC

## 2014-01-22 NOTE — Progress Notes (Signed)
   Subjective:   Patient ID: Michelle Hoffman, female    DOB: 1956-10-19, 58 y.o.   MRN: [id number]  Michelle SersMary M Hun is a pleasant 58 y.o. year old female who presents to clinic today with Neck Pain  on 01/22/2014  HPI: Over a year of progressive left sided neck pain that comes and goes.  Does not radiate down her arm at rest but can when she moves her arm, especially while driving.  Cannot turn head to left as far without pain. No UE weakness.  No known injury.  Has been going to chiropracter with minimal results.  Per pt, she was told she has bone spurs.  Patient Active Problem List   Diagnosis Date Noted  . Cervical pain (neck) 01/22/2014  . Acute sinus infection 12/18/2013  . Right ear pain 12/18/2013  . Fatty liver 02/27/2013  . Left shoulder pain 01/20/2013  . LATERAL EPICONDYLITIS, LEFT 07/19/2010  . HYPERTENSION 11/08/2009  . GERD 11/08/2009   Past Medical History  Diagnosis Date  . Hypertension   . GERD (gastroesophageal reflux disease)   . Allergy    Past Surgical History  Procedure Laterality Date  . Abdominal hysterectomy     History  Substance Use Topics  . Smoking status: Never Smoker   . Smokeless tobacco: Not on file  . Alcohol Use: No   Family History  Problem Relation Age of Onset  . Cervical cancer Mother 8274  . Stroke Father     in 1998   Allergies  Allergen Reactions  . Meloxicam     REACTION: Tongue burns  . Sulfonamide Derivatives     REACTION: Rash   Current Outpatient Prescriptions on File Prior to Visit  Medication Sig Dispense Refill  . fluticasone (FLONASE) 50 MCG/ACT nasal spray USE 2 SPRAYS EACH NOSTRIL EVERY DAY  16 g  5  . ranitidine (ZANTAC) 150 MG tablet TAKE 1 TABLET BY MOUTH TWICE A DAY AS NEEDED FOR REFLUX  60 tablet  5   No current facility-administered medications on file prior to visit.   The PMH, PSH, Social History, Family History, Medications, and allergies have been [facility], and have been updated if  relevant.   Review of Systems See HPI    Objective:    BP 138/82  Pulse 87  Temp(Src) 98 F (36.7 C) (Oral)  Wt 188 lb 8 oz (85.503 kg)  SpO2 97%   Physical Exam  Gen:  Alert, pleasant, NAD MSK:  Decreased range of motion of neck- painful to turn to left Normal grip strength bilaterally Pain elicited with supination Psych:  Good eye contact, not anxious or depressed appearing      Assessment & Plan:   Cervical pain (neck) - Plan: DG Cervical Spine Complete No Follow-up on file.

## 2014-01-22 NOTE — Assessment & Plan Note (Signed)
Progressive with signs of impingement. Start with cervical xray today for evaluation, likely will need MRI to evaluate nerve involvement. The patient indicates understanding of these issues and agrees with the plan.

## 2014-01-22 NOTE — Progress Notes (Signed)
Pre visit review using our clinic review tool, if applicable. No additional management support is needed unless otherwise documented below in the visit note. 

## 2014-01-22 NOTE — Patient Instructions (Signed)
It was good to see you. We will call you with your xray results.

## 2014-02-10 ENCOUNTER — Telehealth: Payer: Self-pay | Admitting: Family Medicine

## 2014-02-10 NOTE — Telephone Encounter (Signed)
That will be up to her PCP - I think it is ok to hold note for her to eval when back in the office but if symptoms worsen in the meantime let us know or go to ED if after hours

## 2014-02-10 NOTE — Telephone Encounter (Signed)
i have also already suggested ortho referral based on her symptoms and xray findings (see result note from cervical xray).  I would be happy to order referral.

## 2014-02-10 NOTE — Telephone Encounter (Signed)
i would like for her to be evaluated if symptoms do not improve.  Could be a variant of GI bug but hard to say without evaluating her.  i have

## 2014-02-10 NOTE — Telephone Encounter (Signed)
Spoke to pt and informed her of advise to see ortho. She states that she is already established with an ortho office and is not needing a referral; she will contact them directly to schedule appt.

## 2014-02-10 NOTE — Telephone Encounter (Signed)
Patient Information:  Caller Name: Michelle Hoffman  Phone: [phone]  Patient: Michelle Hoffman, Michelle Hoffman  Gender: Female  DOB: [birth year]  Age: 58 Years  PCP: Michelle Hoffman, Michelle [facility](Family Practice)  Pregnant: No  Office Follow Up:  Does the office need to follow up with this patient?: Yes  Instructions For The Office: Please advise patient how to proceed.  Preferred callback number is [phone].   Symptoms  Reason For Call & Symptoms: Patient calling. She has had some issues with stool leakage (small amount water fluid) since the last visit that lasted estimated 1 week and gas that was "uncontrollable"; that symptom returned 3/14 through 02/08/14 stopped. That symptom has recurred since the appointment on 01/22/14; she did not inform PCP about that at the last appointment.   Numbness in pinkie and side of her right hand onset 02/09/14.  Go to ED Now or to Office with PCP Approval.  Caller relates she is having neck pain on right similar to left neck pain that was evaluated 01/22/14.  Caller asks if Michelle Hoffman wants to see her of if she needs to go to orthopedics for this issue.  Reviewed Health History In EMR: Yes  Reviewed Medications In EMR: Yes  Reviewed Allergies In EMR: Yes  Reviewed Surgeries / Procedures: Yes  Date of Onset of Symptoms: 02/09/2014  Treatments Tried: Moving/ exercise, rubbing the hand/ arm  Treatments Tried Worked: No OB / GYN:  LMP: Unknown  Guideline(s) Used:  Neurologic Deficit  Disposition Per Guideline:   Go to ED Now (or to Office with PCP Approval)  Reason For Disposition Reached:   Patient sounds very sick or weak to the triager  Advice Given:  Call Back If:  Symptoms do not go away within 30 minutes  You become worse.  Patient Refused Recommendation:  Patient Refused Care Advice  Patient states she feels the problems are related to her neck pain and she does not fee that she needs to go to ED; she asks if she needs to see Michelle Hoffman or follow up with orthopedist regarding  this pain.

## 2014-02-10 NOTE — Telephone Encounter (Signed)
Noted! Thank you

## 2014-02-10 NOTE — Telephone Encounter (Signed)
PCP is out of office.  Routed to Dr. Milinda Antisower as well.

## 2014-02-19 ENCOUNTER — Other Ambulatory Visit: Payer: Self-pay

## 2014-02-19 MED ORDER — AMPHETAMINE-DEXTROAMPHET ER 20 MG PO CP24: ORAL_CAPSULE | ORAL | Status: DC

## 2014-02-19 MED ORDER — AMPHETAMINE-DEXTROAMPHET ER 30 MG PO CP24: 30.0000 mg | ORAL_CAPSULE | ORAL | Status: DC

## 2014-02-19 NOTE — Telephone Encounter (Signed)
Pt left v/m requesting rx adderall 20 mg and 30 mg. Call when ready for pick up.

## 2014-02-19 NOTE — Telephone Encounter (Signed)
Spoke to pt and informed her Rx is available at the front desk for pickup; pt informed gov't issued photo id required for pickup

## 2014-03-03 ENCOUNTER — Other Ambulatory Visit: Payer: Self-pay

## 2014-03-03 DIAGNOSIS — Z1231 Encounter for screening mammogram for malignant neoplasm of breast: Secondary | ICD-10-CM

## 2014-03-08 ENCOUNTER — Telehealth: Payer: Self-pay

## 2014-03-08 NOTE — Telephone Encounter (Signed)
Pt left v/m; [facility] faxed a prior auth for Adderall 20 mg and 30 mg on 03/06/14.pt is out of med and request PA to be done ASAP.

## 2014-03-08 NOTE — Telephone Encounter (Signed)
Spoke to pt and informed her PA request has not been received. Pt states that she will have them to resend it for submission

## 2014-03-12 ENCOUNTER — Ambulatory Visit: Payer: BC Managed Care – PPO

## 2014-03-18 ENCOUNTER — Ambulatory Visit
Admission: RE | Admit: 2014-03-18 | Discharge: 2014-03-18 | Disposition: A | Discharge status: Home / Self Care | Payer: Self-pay | Source: Ambulatory Visit

## 2014-03-18 DIAGNOSIS — Z1231 Encounter for screening mammogram for malignant neoplasm of breast: Secondary | ICD-10-CM

## 2014-03-26 ENCOUNTER — Encounter: Payer: Self-pay | Admitting: Family Medicine

## 2014-04-06 ENCOUNTER — Other Ambulatory Visit: Payer: Self-pay

## 2014-04-06 MED ORDER — AMPHETAMINE-DEXTROAMPHET ER 30 MG PO CP24: 30.0000 mg | ORAL_CAPSULE | ORAL | Status: DC

## 2014-04-06 MED ORDER — AMPHETAMINE-DEXTROAMPHET ER 20 MG PO CP24: ORAL_CAPSULE | ORAL | Status: DC

## 2014-04-06 NOTE — Telephone Encounter (Signed)
Pt left v/m requesting rx adderall XR 20 mg and 30 mg. Call when ready for pick up.

## 2014-04-06 NOTE — Telephone Encounter (Signed)
Spoke to pt and informed her Rx is available for pickup at the front desk; pt informed gov't issued photo id required for pickup.  

## 2014-04-08 ENCOUNTER — Telehealth: Payer: Self-pay | Admitting: *Deleted

## 2014-04-08 NOTE — Telephone Encounter (Signed)
Pt contact office today and requested information regarding PA for adderall. Informed pt that unless it was received 05/04-05/08 while I was out of office, we have not received a PA for this medication. Pt states that she contact [employer] and was informed that they did not cover her medication, and that she would have to contact [employer]. Pt informed that [employer] is only the distributor of medication and although she has a Rx discount card, it is not related to her actual insurance company and the approval of medication. Pt states that she will contact [employer] and have them fax PA directly to [phone].

## 2014-04-08 NOTE — Telephone Encounter (Signed)
Spoke to Eagles MereValerie at [employer] who processed the information online, over the phone. Rx approved and approval # [id number] good through May 13,2018

## 2014-04-09 NOTE — Telephone Encounter (Signed)
Fax received confirming coverage of Rx. Form sent for scanning

## 2014-04-25 ENCOUNTER — Other Ambulatory Visit: Payer: Self-pay | Admitting: Family Medicine

## 2014-04-26 ENCOUNTER — Ambulatory Visit (INDEPENDENT_AMBULATORY_CARE_PROVIDER_SITE_OTHER): Payer: BC Managed Care – PPO | Admitting: Family Medicine

## 2014-04-26 VITALS — BP 132/84 | HR 89 | Temp 98.1°F | Wt 185.8 lb

## 2014-04-26 DIAGNOSIS — R2230 Localized swelling, mass and lump, unspecified upper limb: Secondary | ICD-10-CM

## 2014-04-26 DIAGNOSIS — R229 Localized swelling, mass and lump, unspecified: Secondary | ICD-10-CM

## 2014-04-26 NOTE — Progress Notes (Signed)
Pre visit review using our clinic review tool, if applicable. No additional management support is needed unless otherwise documented below in the visit note. 

## 2014-04-26 NOTE — Progress Notes (Signed)
   Subjective:   Patient ID: Michelle Hoffman, female    DOB: January 30, 1956, 58 y.o.   MRN: [id number]  Michelle Hoffman is a pleasant 58 y.o. year old female who presents to clinic today with Mass  on 04/26/2014  HPI:  Noticed mass under left arm last week. Not growing. Not painful. She has noticed that if it has been draining.  No fevers, nausea or vomiting.  Neg mammogram 03/19/14.  Current Outpatient Prescriptions on File Prior to Visit  Medication Sig Dispense Refill  . amphetamine-dextroamphetamine (ADDERALL XR) 20 MG 24 hr capsule Take one tablet by mouth every evening  30 capsule  0  . amphetamine-dextroamphetamine (ADDERALL XR) 30 MG 24 hr capsule Take 1 capsule (30 mg total) by mouth every morning.  30 capsule  0  . fluticasone (FLONASE) 50 MCG/ACT nasal spray USE 2 SPRAYS EACH NOSTRIL EVERY DAY  16 g  5  . ranitidine (ZANTAC) 150 MG tablet TAKE 1 TABLET BY MOUTH TWICE A DAY AS NEEDED FOR REFLUX  60 tablet  5   No current facility-administered medications on file prior to visit.    Allergies  Allergen Reactions  . Meloxicam     REACTION: Tongue burns  . Sulfonamide Derivatives     REACTION: Rash    Past Medical History  Diagnosis Date  . Hypertension   . GERD (gastroesophageal reflux disease)   . Allergy     Past Surgical History  Procedure Laterality Date  . Abdominal hysterectomy      Family History  Problem Relation Age of Onset  . Cervical cancer Mother 73  . Stroke Father     in 39    History   Social History  . Marital Status: Married    Spouse Name: N/A    Number of Children: 3  . Years of Education: N/A   Occupational History  . 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 Smoking status: Never Smoker   . Smokeless tobacco: Not on file  . Alcohol Use: No  . Drug Use: No  . Sexual Activity: Not on file   Other Topics Concern  . Not on file   Social History Narrative   Lives with husband and 3 children  [phone])   The PMH, PSH, Social History, Family History, Medications, and allergies have been reviewed in [facility], and have been updated if relevant.   Review of Systems    See HPI  Objective:    BP 132/84  Pulse 89  Temp(Src) 98.1 F (36.7 C) (Oral)  Wt 185 lb 12 oz (84.256 kg)  SpO2 96%   Physical Exam   Gen:  Alert, pleasant, NAD Left axilla- dime sized, fluctuant raised lesion, cystic- when pressure applied, drained thick fluid     Assessment & Plan:   Axillary mass No Follow-up on file.

## 2014-04-26 NOTE — Assessment & Plan Note (Signed)
Drained in office- reassurance provided. Advised warm, wet compresses, abx ointment topically. Call or return to clinic prn if these symptoms worsen or fail to improve as anticipated.

## 2014-04-26 NOTE — Patient Instructions (Signed)
Good to see you. I am so sorry to hear about your husband. Keep applying warm compresses and antibiotic ointment under your arm. Call me if does not continue to improve later this week.

## 2014-04-30 ENCOUNTER — Other Ambulatory Visit: Payer: Self-pay

## 2014-04-30 NOTE — Telephone Encounter (Signed)
Pt left v/m requesting refill adderall XR 20 mg and 30 mg. Call when ready for pick up.

## 2014-05-03 MED ORDER — AMPHETAMINE-DEXTROAMPHET ER 30 MG PO CP24: 30.0000 mg | ORAL_CAPSULE | ORAL | Status: DC

## 2014-05-03 MED ORDER — AMPHETAMINE-DEXTROAMPHET ER 20 MG PO CP24: ORAL_CAPSULE | ORAL | Status: DC

## 2014-05-03 NOTE — Telephone Encounter (Signed)
Lm on pts vm informing her Rx is available at the front desk for pickup

## 2014-06-02 ENCOUNTER — Telehealth: Payer: Self-pay | Admitting: Family Medicine

## 2014-06-02 NOTE — Telephone Encounter (Signed)
I am not here this pm but she should be seen by someone.  If we are full, I would recommend urgent care.

## 2014-06-02 NOTE — Telephone Encounter (Signed)
Spoke to pt and advised her per Dr Dayton MartesAron; states that she is on her way to urgent care

## 2014-06-02 NOTE — Telephone Encounter (Signed)
Patient Information:  Caller Name: [name]  Phone: [phone]  Patient: Michelle Hoffman, Michelle Hoffman  Gender: Female  DOB: 07-29-1956  Age: 58 Years  PCP: Ruthe MannanAron, [facility](Family Practice)  Pregnant: No  Office Follow Up:  Does the office need to follow up with this patient?: Yes  Instructions For The Office: She has a painful, red, edemetous side of the right nose, etiology unknown and the inside now has a scab she picked off and did have some controlled bleeding. She states the inside of her right eye and ears are also bothersome.  RN Note:  Afebrile. LMP 7 years ago. Right side of nose is swollen, red and painful, inside had a scab she picked off and the scab was yellow and bleed. Also, the inside corner of her right eye is painful and her ears are bothering her "a little". The pain on the nose is now extremely painful to touch and even hurts if she does not touch it. [facility] offices and she refused to go anywhere else offered due to being at work and those are the only 2 close places.   Symptoms  Reason For Call & Symptoms: Right side of nose, possible bite, eyes hurt too.  Reviewed Health History In EMR: Yes  Reviewed Medications In EMR: Yes  Reviewed Allergies In EMR: Yes  Reviewed Surgeries / Procedures: Yes  Date of Onset of Symptoms: 05/27/2014  Treatments Tried: allergy medications, flonase  Treatments Tried Worked: No OB / GYN:  LMP: Unknown  Guideline(s) Used:  Face Swelling  Disposition Per Guideline:   See Today in Office  Reason For Disposition Reached:   Swelling is painful to touch  Advice Given:  Apply Cold to the Area:  Apply a cool, wet washcloth to the face for 20 minutes.  Reassurance:  Mild face swelling or puffiness can be caused by a mild allergic reaction. For example people can have a reaction to pollen, something they have eaten, a chemical, or some other allergic substance.  Call Back If  Swelling  persists over 3 days  Swelling becomes red or painful to the touch  You become worse.  Patient Will Follow Care Advice:  YES

## 2014-06-08 ENCOUNTER — Other Ambulatory Visit: Payer: Self-pay

## 2014-06-08 MED ORDER — AMPHETAMINE-DEXTROAMPHET ER 30 MG PO CP24: 30.0000 mg | ORAL_CAPSULE | ORAL | Status: DC

## 2014-06-08 MED ORDER — AMPHETAMINE-DEXTROAMPHET ER 20 MG PO CP24: ORAL_CAPSULE | ORAL | Status: DC

## 2014-06-08 NOTE — Telephone Encounter (Signed)
Pt left v/m requesting rx for Adderall XR 20 and 30 mg. Call when ready for pick up.

## 2014-06-08 NOTE — Telephone Encounter (Signed)
Spoke to pt and informed her Rx is available for pick up at the front desk 

## 2014-07-08 ENCOUNTER — Other Ambulatory Visit: Payer: Self-pay

## 2014-07-08 MED ORDER — AMPHETAMINE-DEXTROAMPHET ER 30 MG PO CP24: 30.0000 mg | ORAL_CAPSULE | ORAL | Status: DC

## 2014-07-08 MED ORDER — AMPHETAMINE-DEXTROAMPHET ER 20 MG PO CP24: ORAL_CAPSULE | ORAL | Status: DC

## 2014-07-08 NOTE — Telephone Encounter (Signed)
Pt left v/m requesting rx for Adderall XR 20 mg and 30 mg. Call when ready for pick up.

## 2014-07-08 NOTE — Telephone Encounter (Signed)
Lm on pts vm informing her Rx is available for pickup at the front desk 

## 2014-08-04 ENCOUNTER — Ambulatory Visit: Payer: Self-pay | Admitting: Otolaryngology

## 2014-08-11 ENCOUNTER — Encounter: Payer: BC Managed Care – PPO | Admitting: Family Medicine

## 2014-08-16 ENCOUNTER — Other Ambulatory Visit: Payer: Self-pay | Admitting: *Deleted

## 2014-08-16 MED ORDER — AMPHETAMINE-DEXTROAMPHET ER 20 MG PO CP24: ORAL_CAPSULE | ORAL | Status: DC

## 2014-08-16 MED ORDER — AMPHETAMINE-DEXTROAMPHET ER 30 MG PO CP24: 30.0000 mg | ORAL_CAPSULE | ORAL | Status: DC

## 2014-08-17 MED ORDER — AMPHETAMINE-DEXTROAMPHET ER 20 MG PO CP24: ORAL_CAPSULE | ORAL | Status: DC

## 2014-08-17 MED ORDER — AMPHETAMINE-DEXTROAMPHET ER 30 MG PO CP24: 30.0000 mg | ORAL_CAPSULE | ORAL | Status: DC

## 2014-08-17 NOTE — Addendum Note (Signed)
Addended by: Desmond Dike on: 08/17/2014 09:31 AM   Modules accepted: Orders

## 2014-08-17 NOTE — Telephone Encounter (Signed)
Lm on pts vm informing her Rx will be available for pickup after 1400 

## 2014-08-17 NOTE — Telephone Encounter (Signed)
Original Rx did not print.   

## 2014-08-19 ENCOUNTER — Encounter: Payer: Self-pay | Admitting: Family Medicine

## 2014-08-20 ENCOUNTER — Ambulatory Visit: Payer: BC Managed Care – PPO | Admitting: Family Medicine

## 2014-08-24 ENCOUNTER — Ambulatory Visit (INDEPENDENT_AMBULATORY_CARE_PROVIDER_SITE_OTHER): Payer: BC Managed Care – PPO | Admitting: Family Medicine

## 2014-08-24 ENCOUNTER — Encounter: Payer: Self-pay | Admitting: Family Medicine

## 2014-08-24 VITALS — BP 114/68 | HR 95 | Temp 98.3°F | Wt 184.4 lb

## 2014-08-24 DIAGNOSIS — Z7185 Encounter for immunization safety counseling: Secondary | ICD-10-CM

## 2014-08-24 DIAGNOSIS — Z23 Encounter for immunization: Secondary | ICD-10-CM

## 2014-08-24 DIAGNOSIS — Z7189 Other specified counseling: Secondary | ICD-10-CM

## 2014-08-24 NOTE — Assessment & Plan Note (Signed)
Will check titers today- discussed with Terri. Influenza vaccine given today.

## 2014-08-24 NOTE — Progress Notes (Signed)
   Subjective:   Patient ID: Michelle Hoffman, female    DOB: 10-23-1956, 58 y.o.   MRN: [id number]  Michelle SersMary M Ravelo is a pleasant 58 y.o. year old female who presents to clinic today with Immunizations  on 08/24/2014  HPI: Here to discuss immunizations for school. Brings forms with her which state that she needs proof of receiving 3 tetanus immunizations. Received Td on 09/10/2002 and Tdap on 09/20/2011.  Current Outpatient Prescriptions on File Prior to Visit  Medication Sig Dispense Refill  . amphetamine-dextroamphetamine (ADDERALL XR) 20 MG 24 hr capsule Take one tablet by mouth every evening  30 capsule  0  . amphetamine-dextroamphetamine (ADDERALL XR) 30 MG 24 hr capsule Take 1 capsule (30 mg total) by mouth every morning.  30 capsule  0  . celecoxib (CELEBREX) 200 MG capsule Take 200 mg by mouth as needed.      . fluticasone (FLONASE) 50 MCG/ACT nasal spray USE 2 SPRAYS EACH NOSTRIL EVERY DAY  16 g  5  . ranitidine (ZANTAC) 150 MG tablet TAKE 1 TABLET BY MOUTH TWICE A DAY AS NEEDED FOR REFLUX  60 tablet  5   No current facility-administered medications on file prior to visit.    Allergies  Allergen Reactions  . Meloxicam     REACTION: Tongue burns  . Sulfonamide Derivatives     REACTION: Rash    Past Medical History  Diagnosis Date  . Hypertension   . GERD (gastroesophageal reflux disease)   . Allergy     Past Surgical History  Procedure Laterality Date  . Abdominal hysterectomy      Family History  Problem Relation Age of Onset  . Cervical cancer Mother [id number]  . Stroke Father     in 51998    History   Social History  . Marital Status: Married    Spouse Name: N/A    Number of Children: 3  . Years of Education: N/A   Occupational History  . 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 Smoking status: Never Smoker   . Smokeless tobacco: Not on file  . Alcohol Use: No  . Drug Use: No  . Sexual Activity: Not on file   Other Topics  Concern  . Not on file   Social History Narrative   Lives with husband and 3 children [phone](16,17,31)   The PMH, PSH, Social History, Family History, Medications, and allergies have been [facility], and have been updated if relevant.   Review of Systems See HPI    Objective:    BP 114/68  Pulse 95  Temp(Src) 98.3 F (36.8 C)  Wt 184 lb 6.4 oz (83.643 kg)  SpO2 90%   Physical Exam  Nursing note and vitals reviewed. Constitutional: She is oriented to person, place, and time. She appears well-developed and well-nourished. No distress.  Cardiovascular: Normal rate.   Pulmonary/Chest: Effort normal. No respiratory distress.  Neurological: She is alert and oriented to person, place, and time.  Skin: Skin is warm and dry.  Psychiatric: She has a normal mood and affect. Her behavior is normal. Judgment and thought content normal.          Assessment & Plan:   Need for prophylactic vaccination and inoculation against influenza - Plan: Flu Vaccine QUAD 36+ mos PF IM (Fluarix Quad PF)  Encounter for counseling regarding immunization No Follow-up on file.

## 2014-08-24 NOTE — Addendum Note (Signed)
Addended by: Alvina ChouWALSH, TERRI J on: 08/24/2014 12:35 PM   Modules accepted: Orders

## 2014-08-24 NOTE — Progress Notes (Signed)
Pre visit review using our clinic review tool, if applicable. No additional management support is needed unless otherwise documented below in the visit note. 

## 2014-08-25 ENCOUNTER — Other Ambulatory Visit: Payer: Self-pay | Admitting: *Deleted

## 2014-08-25 MED ORDER — FLUTICASONE PROPIONATE 50 MCG/ACT NA SUSP: NASAL | Status: DC

## 2014-08-31 ENCOUNTER — Encounter: Payer: Self-pay | Admitting: Family Medicine

## 2014-09-03 ENCOUNTER — Encounter: Payer: Self-pay | Admitting: Family Medicine

## 2014-09-06 ENCOUNTER — Telehealth: Payer: Self-pay

## 2014-09-06 NOTE — Telephone Encounter (Signed)
Pt request copy of immunizations and recent titer results.advised pt at front desk for pick up.

## 2014-09-08 ENCOUNTER — Other Ambulatory Visit: Payer: Self-pay

## 2014-09-08 MED ORDER — AMPHETAMINE-DEXTROAMPHET ER 30 MG PO CP24: 30.0000 mg | ORAL_CAPSULE | ORAL | Status: DC

## 2014-09-08 MED ORDER — AMPHETAMINE-DEXTROAMPHET ER 20 MG PO CP24: ORAL_CAPSULE | ORAL | Status: DC

## 2014-09-08 NOTE — Telephone Encounter (Signed)
Spoke to pt and informed her Rx will be available for pickup from the front desk after 1400

## 2014-09-08 NOTE — Telephone Encounter (Signed)
Pt left v/m requesting rx for Adderall XR 20 mg and 30mg . Call when ready for pick up. Prescriptions last printed 08/17/14.

## 2014-09-22 ENCOUNTER — Encounter: Payer: BC Managed Care – PPO | Admitting: Family Medicine

## 2014-10-18 ENCOUNTER — Other Ambulatory Visit: Payer: Self-pay

## 2014-10-18 NOTE — Telephone Encounter (Signed)
Pt left v/m requesting rx for Adderall. Call when ready for pick up. Not sure last date when seen for ADD appt.

## 2014-10-19 MED ORDER — AMPHETAMINE-DEXTROAMPHET ER 30 MG PO CP24: 30.0000 mg | ORAL_CAPSULE | ORAL | Status: DC

## 2014-10-19 MED ORDER — AMPHETAMINE-DEXTROAMPHET ER 20 MG PO CP24: ORAL_CAPSULE | ORAL | Status: DC

## 2014-10-19 NOTE — Telephone Encounter (Signed)
Spoke to pt and informed her Rx is available for pickup from the front desk after 1400

## 2014-11-16 ENCOUNTER — Ambulatory Visit (INDEPENDENT_AMBULATORY_CARE_PROVIDER_SITE_OTHER): Payer: BC Managed Care – PPO | Admitting: Family Medicine

## 2014-11-16 ENCOUNTER — Encounter: Payer: Self-pay | Admitting: Family Medicine

## 2014-11-16 VITALS — BP 116/74 | HR 87 | Temp 97.8°F | Wt 185.5 lb

## 2014-11-16 DIAGNOSIS — F909 Attention-deficit hyperactivity disorder, unspecified type: Secondary | ICD-10-CM

## 2014-11-16 DIAGNOSIS — F988 Other specified behavioral and emotional disorders with onset usually occurring in childhood and adolescence: Secondary | ICD-10-CM

## 2014-11-16 MED ORDER — AMPHETAMINE-DEXTROAMPHET ER 20 MG PO CP24: ORAL_CAPSULE | ORAL | Status: DC

## 2014-11-16 MED ORDER — AMPHETAMINE-DEXTROAMPHET ER 30 MG PO CP24: 30.0000 mg | ORAL_CAPSULE | ORAL | Status: DC

## 2014-11-16 NOTE — Progress Notes (Signed)
   Subjective:   Patient ID: Michelle Hoffman, female    DOB: 1956-02-07, 58 y.o.   MRN: [id number]  Michelle Hoffman is a pleasant 58 y.o. year old female who presents to clinic today with Follow-up  on 11/16/2014  HPI:  ADD- diagnosed with formal eval (Dr. Laymond PurserPerrin) a few years ago. Doing well on current dose of Adderall. Has been working and going to school. Was told she needed to follow up for further refills.  Current Outpatient Prescriptions on File Prior to Visit  Medication Sig Dispense Refill  . celecoxib (CELEBREX) 200 MG capsule Take 200 mg by mouth as needed.    . fluticasone (FLONASE) 50 MCG/ACT nasal spray USE 2 SPRAYS EACH NOSTRIL EVERY DAY 16 g 5  . ranitidine (ZANTAC) 150 MG tablet TAKE 1 TABLET BY MOUTH TWICE A DAY AS NEEDED FOR REFLUX 60 tablet 5   No current facility-administered medications on file prior to visit.    Allergies  Allergen Reactions  . Meloxicam     REACTION: Tongue burns  . Sulfonamide Derivatives     REACTION: Rash    Past Medical History  Diagnosis Date  . Hypertension   . GERD (gastroesophageal reflux disease)   . Allergy     Past Surgical History  Procedure Laterality Date  . Abdominal hysterectomy      Family History  Problem Relation Age of Onset  . Cervical cancer Mother 274  . Stroke Father     in [id number]    History   Social History  . Marital Status: Married    Spouse Name: N/A    Number of Children: 3  . Years of Education: N/A   Occupational History  . 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 Smoking status: Never Smoker   . Smokeless tobacco: Not on file  . Alcohol Use: No  . Drug Use: No  . Sexual Activity: Not on file   Other Topics Concern  . Not on file   Social History Narrative   Lives with husband and 3 children [phone])   The PMH, PSH, Social History, Family History, Medications, and [facility], and have been updated if relevant.     Review of  Systems  Constitutional: Negative.   Respiratory: Negative.   Cardiovascular: Negative.   Neurological: Negative.   Hematological: Negative.   Psychiatric/Behavioral: Negative.        Objective:    BP 116/74 mmHg  Pulse 87  Temp(Src) 97.8 F (36.6 C) (Oral)  Wt 185 lb 8 oz (84.142 kg)  SpO2 98%   Physical Exam  Constitutional: She is oriented to person, place, and time. She appears well-developed and well-nourished. No distress.  HENT:  Head: Normocephalic.  Eyes: Conjunctivae are normal.  Cardiovascular: Normal rate and regular rhythm.   Pulmonary/Chest: Effort normal and breath sounds normal. No respiratory distress.  Musculoskeletal: Normal range of motion.  Neurological: She is alert and oriented to person, place, and time. No cranial nerve deficit.  Skin: Skin is warm and dry.  Psychiatric: She has a normal mood and affect. Her behavior is normal. Judgment and thought content normal.          Assessment & Plan:   ADD (attention deficit disorder) No Follow-up on file.

## 2014-11-16 NOTE — Progress Notes (Signed)
Pre visit review using our clinic review tool, if applicable. No additional management support is needed unless otherwise documented below in the visit note. 

## 2014-11-16 NOTE — Assessment & Plan Note (Signed)
>  15 minutes spent in face to face time with patient, >50% spent in counselling or coordination of care. Well controlled on current rxs. Rxs printed and given to pt. UDS UTD.

## 2014-12-24 ENCOUNTER — Other Ambulatory Visit: Payer: Self-pay

## 2014-12-24 MED ORDER — AMPHETAMINE-DEXTROAMPHET ER 30 MG PO CP24: 30.0000 mg | ORAL_CAPSULE | ORAL | Status: DC

## 2014-12-24 MED ORDER — AMPHETAMINE-DEXTROAMPHET ER 20 MG PO CP24: ORAL_CAPSULE | ORAL | Status: DC

## 2014-12-24 NOTE — Telephone Encounter (Signed)
Pt left v/m requesting rx for Adderall. Call when ready for pick up. Pt last seen 11/16/14.

## 2014-12-27 MED ORDER — AMPHETAMINE-DEXTROAMPHET ER 30 MG PO CP24: 30.0000 mg | ORAL_CAPSULE | ORAL | Status: DC

## 2014-12-27 MED ORDER — AMPHETAMINE-DEXTROAMPHET ER 20 MG PO CP24: ORAL_CAPSULE | ORAL | Status: DC

## 2014-12-27 NOTE — Addendum Note (Signed)
Addended by: Desmond DikeKNIGHT, Tephanie Escorcia H on: 12/27/2014 10:47 AM   Modules accepted: Orders

## 2014-12-27 NOTE — Telephone Encounter (Signed)
Spoke to pt and informed her Rx will be available for pickup from the front desk

## 2014-12-30 ENCOUNTER — Ambulatory Visit: Payer: Self-pay | Admitting: Emergency Medicine

## 2014-12-30 LAB — RAPID STREP-A WITH REFLX: Micro Text Report: NEGATIVE

## 2015-01-02 LAB — BETA STREP CULTURE(ARMC)

## 2015-01-21 ENCOUNTER — Other Ambulatory Visit: Payer: Self-pay

## 2015-01-21 MED ORDER — AMPHETAMINE-DEXTROAMPHET ER 20 MG PO CP24: ORAL_CAPSULE | ORAL | Status: DC

## 2015-01-21 MED ORDER — AMPHETAMINE-DEXTROAMPHET ER 30 MG PO CP24: 30.0000 mg | ORAL_CAPSULE | ORAL | Status: DC

## 2015-01-21 NOTE — Telephone Encounter (Signed)
Can you please reprint and sign these, please.

## 2015-01-21 NOTE — Telephone Encounter (Signed)
Not due until next Tuesday, can wait for Dr. Dayton MartesAron return

## 2015-01-21 NOTE — Telephone Encounter (Signed)
Pt left v/m requesting rx for Adderall XR 20 mg and 30 mg. Call when ready for pick up. Pt last seen 11/16/14 and last rxs printed 12/27/14.

## 2015-01-21 NOTE — Addendum Note (Signed)
Addended by: Desmond DikeKNIGHT, Raffaella Edison H on: 01/21/2015 11:35 AM   Modules accepted: Orders

## 2015-01-24 MED ORDER — AMPHETAMINE-DEXTROAMPHET ER 20 MG PO CP24: ORAL_CAPSULE | ORAL | Status: DC

## 2015-01-24 MED ORDER — AMPHETAMINE-DEXTROAMPHET ER 30 MG PO CP24: 30.0000 mg | ORAL_CAPSULE | ORAL | Status: DC

## 2015-01-24 NOTE — Telephone Encounter (Signed)
Lm on pts vm informing her Rx is available for pickup after 1400.

## 2015-01-24 NOTE — Addendum Note (Signed)
Addended by: Desmond DikeKNIGHT, Lorenza Winkleman H on: 01/24/2015 08:43 AM   Modules accepted: Orders

## 2015-03-14 ENCOUNTER — Other Ambulatory Visit: Payer: Self-pay

## 2015-03-14 MED ORDER — AMPHETAMINE-DEXTROAMPHET ER 30 MG PO CP24: 30.0000 mg | ORAL_CAPSULE | ORAL | Status: DC

## 2015-03-14 MED ORDER — AMPHETAMINE-DEXTROAMPHET ER 20 MG PO CP24: ORAL_CAPSULE | ORAL | Status: DC

## 2015-03-14 NOTE — Telephone Encounter (Signed)
Lm on pts vm informing her Rx is available for pickup from the front desk 

## 2015-03-14 NOTE — Telephone Encounter (Signed)
Pt left v/m requesting rx adderall XR 20 mg and 30 mg. Pt is running low on med and request cb when ready for pick up. Pt last seen 11/16/14.

## 2015-03-15 NOTE — H&P (Signed)
PATIENT NAME:  Michelle Hoffman, Michelle Hoffman MR#:  [id number] DATE OF BIRTH:  1956/09/10  DATE OF ADMISSION:  11/01/2012  [facility] [place] [place]   CHIEF COMPLAINT: Pain all over.   HISTORY OF PRESENT ILLNESS: 59 year old female came to the ER twice within the last 24 hours for neck pain, back pain and side pain. In the Emergency Room she had a CT of her cervical spine, abdomen and chest. Her CT showed possible pneumonitis/pneumonia. Patient denies any cough, fevers, or chills, however, she just says she feels very bad. She had a recent cold-like virus. Her white blood cell count is noted to be elevated.   REVIEW OF SYSTEMS: CONSTITUTIONAL: She denies any fevers. She has fatigue, weakness. No weight loss, weight gain. EYES: No blurry or double vision, glaucoma, cataracts. ENT: No hearing loss. She does have seasonal allergies. No sinus pain, postnasal drip or snoring. RESPIRATORY: She denies any cough, wheezing, hemoptysis, dyspnea. She has some pain when she takes a deep breath in on the left side. GASTROINTESTINAL: No nausea, vomiting, diarrhea, abdominal pain, melena, or ulcers. GENITOURINARY: No dysuria, hematuria. ENDOCRINE: No polyuria, polydipsia. HEME/LYMPH: No easy bruising, bleeding. SKIN: No rashes or lesions. MUSCULOSKELETAL: She has chronic neck pain and lower back pain. No limited activity. NEUROLOGICAL: No history of CVA, TIA, seizures. PSYCHIATRIC: Positive attention deficit/hyperactivity disorder.   PAST MEDICAL HISTORY: 1. Attention deficit/hyperactivity disorder.  2. Chronic back pain and neck pain with arthritis.   MEDICATIONS: Adderall 30 mg in at 1:00 p.Hoffman. and 20 mg at 8:00 p.Hoffman.   ALLERGIES: No known drug allergies.    PAST SURGICAL HISTORY:  1. Three C-sections. 2. Surgery for plantar fasciitis. 3. Carpal tunnel syndrome.   SOCIAL HISTORY: Denies alcohol or drug use.   FAMILY HISTORY: Positive for cerebrovascular accident and cervical cancer.   PHYSICAL EXAMINATION:   VITAL SIGNS: Temperature 98.1, pulse 94, respirations 18, blood pressure 113/67, 95% on room air.   GENERAL: Patient is alert and oriented, not in acute distress.   HEENT: Head is atraumatic. Pupils are round. . Mucous membranes are moist. Oropharynx is clear.Marland Kitchen.   NECK: Supple without jugular venous distention, carotid bruit, enlarged thyroid.   CARDIOVASCULAR: Regular rate and rhythm. No murmurs, gallops, or rubs. PMI is nondisplaced.   LUNGS: She had crackles at the bases, however, when she coughs these have disappeared. No rhonchi or wheezing are heard. No dullness to percussion.   BACK: No CVA or vertebral tenderness.   ABDOMEN: Bowel sounds are positive. Nontender, nondistended. No hepatosplenomegaly.   EXTREMITIES: No clubbing, cyanosis, edema.   NEUROLOGICAL: Cranial nerves II through XII are intact. No focal deficits.   SKIN: Without rash or lesions.   LABORATORY, DIAGNOSTIC AND RADIOLOGIC DATA: White blood cells 17, hemoglobin 12.6, hematocrit 37, platelets 375, neutrophils 79.7, sodium 134, potassium 3.9, chloride 99, bicarbonate 29, BUN 12, creatinine 0.63, glucose 99, calcium 9.5, bilirubin 0.6, alkaline phosphatase 80, ALT 25, AST 19, total protein 8.1, albumin 3.7. Urinalysis shows 1+ leukocyte esterase. Chest x-ray shows no acute cardiopulmonary disease. She does have some evidence of some atelectasis. CT chest for PE was negative for pulmonary emboli, however, she has some heterogeneous opacities in the lung base likely representing atelectasis and nonspecific pneumonitis cannot be excluded. CT of the abdomen shows no evidence of renal calculi. CT cervical spine shows no acute fracture. She does not cervical spondylosis.   CT head shows no acute intracranial hemorrhage or cerebrovascular accident.   ASSESSMENT AND PLAN: 59 year old female who presents with  just not feeling well, found to have elevated white blood cell count and probable pneumonia.  1. Pneumonia. Patient  will be admitted for observation. Blood cultures have been ordered. Continue Levaquin and follow white blood cell count.  2. Leukocytosis, likely secondary to pneumonia, community-acquired. Will continue to follow.  3. Attention deficit/hyperactivity disorder. Continue Adderall.  4. Chronic arthritis pain. Continue p.r.n. pain medications.  5. Patient is FULL CODE status.  TIME SPENT: 40 minutes.   ____________________________ Janyth Contes. Juliene Pina, MD spm:cms D: 11/01/2012 11:34:00 ET T: 11/01/2012 11:47:34 ET JOB#: [id number]  cc: Huberta Tompkins P. Juliene Pina, MD, <Dictator> [facility] P Revia Nghiem MD ELECTRONICALLY SIGNED 11/02/2012 8:31

## 2015-03-15 NOTE — Discharge Summary (Signed)
PATIENT NAME:  Michelle Hoffman, Michelle Hoffman MR#:  [id number] DATE OF BIRTH:  [birth year]  DATE OF ADMISSION:  11/01/2012 DATE OF DISCHARGE:  11/02/2012  ADMISSION DIAGNOSIS: Pneumonia.   DISCHARGE DIAGNOSES: 1. Pneumonia.  2. Arthritis.  3. Attention deficit/hyperactivity disorder.   LABORATORY, DIAGNOSTIC AND RADIOLOGICAL DATA: Pertinent laboratory at discharge: Right shoulder x-ray was negative for acute fracture. CT of the abdomen showed no evidence of a kidney stone. Blood culture are negative to date. CT of the chest showed atelectasis, possible pneumonia, no pulmonary embolus.   HOSPITAL COURSE: 59 year old female who was not feeling well, had multiple complaints, was noted to have some pneumonia on chest x-ray with elevated white blood cell count. For further details, please refer to the history and physical.  1. Pneumonia. Patient was treated for community-acquired pneumonia on Levaquin. She actually is doing quite well from her pneumonia standpoint of view. She is not hypoxic, not tachycardic. Her lungs are clear to auscultation. She will need three more days of Levaquin at discharge.   2. Shoulder pain secondary to arthritis. Patient will follow with the orthopedic surgeon as outpatient. 3. Attention deficit/hyperactivity disorder. Patient may continue outpatient medications.  DISCHARGE MEDICATIONS: 1. Adderall 30 mg daily.  2. Valium 5 mg every eight hours.  3. Tramadol 50 mg q.4 hours p.r.n. pain.  4. Levaquin 750 mg every 24 hours x3 days.   DISCHARGE DIET: Regular.   DISCHARGE ACTIVITY: As tolerated.   DISCHARGE RETURN TO WORK: Wednesday, 11/05/2012.   DISCHARGE FOLLOW UP: Patient will follow up with Dr. Ruthe Mannanalia Aron in 1 to 2 weeks.   TIME SPENT: 35 minutes.   ____________________________ Janyth ContesSital P. Juliene PinaMody, MD spm:cms D: 11/02/2012 10:33:49 ET T: 11/02/2012 12:38:45 ET JOB#: [id number]  cc: Adeliz Tonkinson P. Juliene PinaMody, MD, <Dictator> [name]. Dayton MartesAron, MD Janyth ContesSITAL P Amy Gothard MD ELECTRONICALLY SIGNED  11/03/2012 7:33

## 2015-03-22 ENCOUNTER — Encounter: Payer: Self-pay | Admitting: Family Medicine

## 2015-04-18 ENCOUNTER — Other Ambulatory Visit: Payer: Self-pay

## 2015-04-18 ENCOUNTER — Encounter: Payer: Self-pay | Admitting: Family Medicine

## 2015-04-18 NOTE — Telephone Encounter (Signed)
Pt left v/m requesting rx for Adderall XR 20 mg and 30 mg. Call when ready for pick up.Rxs last printed on 03/14/15 and pt last seen 11/16/14.

## 2015-04-19 ENCOUNTER — Telehealth: Payer: Self-pay | Admitting: Family Medicine

## 2015-04-19 MED ORDER — AMPHETAMINE-DEXTROAMPHET ER 30 MG PO CP24: 30.0000 mg | ORAL_CAPSULE | ORAL | Status: DC

## 2015-04-19 MED ORDER — AMPHETAMINE-DEXTROAMPHET ER 20 MG PO CP24: ORAL_CAPSULE | ORAL | Status: DC

## 2015-04-19 NOTE — Telephone Encounter (Signed)
ERROR

## 2015-04-19 NOTE — Telephone Encounter (Signed)
Attempted to contact pt; vm not set up. Rx is available for pickup from the front desk. 

## 2015-05-25 ENCOUNTER — Other Ambulatory Visit: Payer: Self-pay

## 2015-05-25 NOTE — Telephone Encounter (Signed)
Pt left v/m requesting rx for Adderall XR 20 mg and 30 mg. Both last printed # 30 each on 04/19/15.pt last seen 11/16/14. Call when ready for pick up.

## 2015-05-25 NOTE — Telephone Encounter (Signed)
Dr. Dayton MartesAron last note reviewed, RX printed and placed on [facility] desk

## 2015-05-26 MED ORDER — AMPHETAMINE-DEXTROAMPHET ER 20 MG PO CP24: ORAL_CAPSULE | ORAL | Status: DC

## 2015-05-26 MED ORDER — AMPHETAMINE-DEXTROAMPHET ER 30 MG PO CP24: 30.0000 mg | ORAL_CAPSULE | ORAL | Status: DC

## 2015-05-26 NOTE — Telephone Encounter (Signed)
Spoke to pt and informed her Rx is available for pickup from the front desk 

## 2015-06-23 ENCOUNTER — Other Ambulatory Visit: Payer: Self-pay

## 2015-06-23 MED ORDER — AMPHETAMINE-DEXTROAMPHET ER 20 MG PO CP24: ORAL_CAPSULE | ORAL | Status: DC

## 2015-06-23 MED ORDER — AMPHETAMINE-DEXTROAMPHET ER 30 MG PO CP24: 30.0000 mg | ORAL_CAPSULE | ORAL | Status: DC

## 2015-06-23 NOTE — Telephone Encounter (Signed)
Spoke to pt and informed her Rx is available for pickup from the front desk 

## 2015-06-23 NOTE — Telephone Encounter (Signed)
Pt left v/m requesting rx for Adderall XR 20 mg and 30 mg; both rx last printed # 30 on 05/26/15. Call when ready for pick up. Last seen 11/16/2014.

## 2015-06-29 ENCOUNTER — Telehealth: Payer: Self-pay

## 2015-06-29 NOTE — Telephone Encounter (Signed)
Pt left v/m; pt had to change pharmacies due to ins. Pt request prior auth for Adderall; pt request to be marked urgent. Pt request cb.

## 2015-06-29 NOTE — Telephone Encounter (Signed)
Lm on pts vm and informed her I had not received request for PA.Advised pt that her insurance approved her medication through May 2018 (see 03/2014 phone note) and coverage should remain the same. Pt must also sign a new contract before she is able to have medication switched to a new pharmacy as this medication is a controlled substance

## 2015-07-25 ENCOUNTER — Other Ambulatory Visit: Payer: Self-pay | Admitting: *Deleted

## 2015-07-25 MED ORDER — AMPHETAMINE-DEXTROAMPHET ER 30 MG PO CP24: 30.0000 mg | ORAL_CAPSULE | ORAL | Status: DC

## 2015-07-25 MED ORDER — AMPHETAMINE-DEXTROAMPHET ER 20 MG PO CP24: ORAL_CAPSULE | ORAL | Status: DC

## 2015-07-25 NOTE — Telephone Encounter (Signed)
Patient left a voicemail that she needs refills on her Adderall. Call when ready for pickup.

## 2015-07-25 NOTE — Telephone Encounter (Signed)
Lm on pts vm informing her Rx is available for pickup from the front desk 

## 2015-08-27 ENCOUNTER — Other Ambulatory Visit: Payer: Self-pay | Admitting: Family Medicine

## 2015-09-01 ENCOUNTER — Other Ambulatory Visit: Payer: Self-pay

## 2015-09-01 MED ORDER — AMPHETAMINE-DEXTROAMPHET ER 30 MG PO CP24: 30.0000 mg | ORAL_CAPSULE | ORAL | Status: DC

## 2015-09-01 MED ORDER — AMPHETAMINE-DEXTROAMPHET ER 20 MG PO CP24: ORAL_CAPSULE | ORAL | Status: DC

## 2015-09-01 NOTE — Telephone Encounter (Signed)
Pt left v/m requesting rx for Adderall. Call when ready for pick up. Both rx printed # 30 each on 07/25/15.pt last seen 11/16/2014. Pt will be out of med on 09/04/15; Dr Dayton Martes out of office until 09/05/15. Will send note to Dr Alphonsus Sias.

## 2015-09-02 NOTE — Telephone Encounter (Signed)
Pt left v/m requesting cb with status of adderall rxs.

## 2015-09-02 NOTE — Telephone Encounter (Signed)
Spoke to pt and informed her Rx is available for pickup from the front desk 

## 2015-09-26 ENCOUNTER — Ambulatory Visit: Payer: Self-pay | Admitting: Family Medicine

## 2015-10-03 ENCOUNTER — Ambulatory Visit: Payer: Self-pay | Admitting: Family Medicine

## 2015-10-06 ENCOUNTER — Other Ambulatory Visit: Payer: Self-pay

## 2015-10-06 MED ORDER — AMPHETAMINE-DEXTROAMPHET ER 30 MG PO CP24: 30.0000 mg | ORAL_CAPSULE | ORAL | Status: DC

## 2015-10-06 MED ORDER — AMPHETAMINE-DEXTROAMPHET ER 20 MG PO CP24: ORAL_CAPSULE | ORAL | Status: DC

## 2015-10-06 NOTE — Telephone Encounter (Signed)
I have refilled this since she has been seen within a year---but canceled 2 appointments. Please send to Dr Dayton MartesAron to comment on whether she should be getting Rx despite no recent appointments

## 2015-10-06 NOTE — Telephone Encounter (Signed)
Pt left v/m requesting rx for Adderall XR 20 mg and 30 mg (both rx printed # 30 each on 09/01/15. Last seen 11/16/14.no future appt scheduled. Call when ready for pick up.Dr Dayton MartesAron out of office until 10/10/15.

## 2015-10-07 ENCOUNTER — Encounter: Payer: Self-pay | Admitting: Family Medicine

## 2015-10-07 NOTE — Telephone Encounter (Signed)
Left message on machine that rx is ready for pick-up, and it will be at our front desk and that she would need to schedule an appointment before she can get any further refills.

## 2015-10-24 ENCOUNTER — Ambulatory Visit: Payer: Self-pay | Admitting: Family Medicine

## 2015-10-27 ENCOUNTER — Ambulatory Visit: Payer: Self-pay | Admitting: Family Medicine

## 2015-10-28 ENCOUNTER — Encounter: Payer: Self-pay | Admitting: Family Medicine

## 2015-11-07 ENCOUNTER — Ambulatory Visit: Payer: Self-pay | Admitting: Family Medicine

## 2015-11-14 ENCOUNTER — Ambulatory Visit: Payer: Self-pay | Admitting: Family Medicine

## 2015-11-15 ENCOUNTER — Encounter: Payer: Self-pay | Admitting: Family Medicine

## 2015-11-15 ENCOUNTER — Ambulatory Visit (INDEPENDENT_AMBULATORY_CARE_PROVIDER_SITE_OTHER): Payer: 59 | Admitting: Family Medicine

## 2015-11-15 VITALS — BP 120/82 | HR 90 | Temp 97.9°F | Wt 190.8 lb

## 2015-11-15 DIAGNOSIS — Z111 Encounter for screening for respiratory tuberculosis: Secondary | ICD-10-CM

## 2015-11-15 DIAGNOSIS — F909 Attention-deficit hyperactivity disorder, unspecified type: Secondary | ICD-10-CM

## 2015-11-15 DIAGNOSIS — I10 Essential (primary) hypertension: Secondary | ICD-10-CM

## 2015-11-15 DIAGNOSIS — F988 Other specified behavioral and emotional disorders with onset usually occurring in childhood and adolescence: Secondary | ICD-10-CM

## 2015-11-15 MED ORDER — AMPHETAMINE-DEXTROAMPHET ER 20 MG PO CP24: ORAL_CAPSULE | ORAL | Status: DC

## 2015-11-15 NOTE — Progress Notes (Signed)
Subjective:   Patient ID: Michelle Hoffman, female    DOB: 08/29/1956, 59 y.o.   MRN: [id number]  Michelle Hoffman is a pleasant 59 y.o. year old female who presents to clinic today with Medication Refill and head congestion  on 11/15/2015  HPI:  ADD- diagnosed with formal eval (Dr. Laymond Purser) a few years ago. Doing well on current dose of Adderall. Has been working and going to school but almost done with school.  Currently taking Adderall XR 30 mg every morning and Adderall XR 20 mg every evening.  She wants to try to decrease dose.   Current Outpatient Prescriptions on File Prior to Visit  Medication Sig Dispense Refill  . amphetamine-dextroamphetamine (ADDERALL XR) 20 MG 24 hr capsule Take one tablet by mouth every evening 30 capsule 0  . amphetamine-dextroamphetamine (ADDERALL XR) 30 MG 24 hr capsule Take 1 capsule (30 mg total) by mouth every morning. 30 capsule 0  . fluticasone (FLONASE) 50 MCG/ACT nasal spray instill 2 sprays into each nostril once daily 16 g 3  . ranitidine (ZANTAC) 150 MG tablet TAKE 1 TABLET BY MOUTH TWICE A DAY AS NEEDED FOR REFLUX 60 tablet 5   No current facility-administered medications on file prior to visit.    Allergies  Allergen Reactions  . Meloxicam     REACTION: Tongue burns  . Sulfonamide Derivatives     REACTION: Rash    Past Medical History  Diagnosis Date  . Hypertension   . GERD (gastroesophageal reflux disease)   . Allergy     Past Surgical History  Procedure Laterality Date  . Abdominal hysterectomy      Family History  Problem Relation Age of Onset  . Cervical cancer Mother 59  . Stroke Father     in 74    Social History   Social History  . Marital Status: Married    Spouse Name: N/A  . Number of Children: 3  . Years of Education: N/A   Occupational History  . 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 Smoking status: Never Smoker   . Smokeless tobacco: Never Used  .  Alcohol Use: No  . Drug Use: No  . Sexual Activity: Not on file   Other Topics Concern  . Not on file   Social History Narrative   Lives with husband and 3 children [phone])   The PMH, PSH, Social History, Family History, Medications, and allergies have been reviewed in [facility], and have been updated if relevant.     Review of Systems  Constitutional: Negative.   Respiratory: Negative.   Cardiovascular: Negative.   Neurological: Negative.   Hematological: Negative.   Psychiatric/Behavioral: Negative.        Objective:    BP 120/82 mmHg  Pulse 90  Temp(Src) 97.9 F (36.6 C) (Oral)  Wt 190 lb 12 oz (86.524 kg)  SpO2 98%   Physical Exam  Constitutional: She is oriented to person, place, and time. She appears well-developed and well-nourished. No distress.  HENT:  Head: Normocephalic.  Eyes: Conjunctivae are normal.  Cardiovascular: Normal rate and regular rhythm.   Pulmonary/Chest: Effort normal and breath sounds normal. No respiratory distress.  Musculoskeletal: Normal range of motion.  Neurological: She is alert and oriented to person, place, and time. No cranial nerve deficit.  Skin: Skin is warm and dry.  Psychiatric: She has a normal mood and affect. Her behavior is normal. Judgment and thought content normal.  Assessment & Plan:   Essential hypertension  ADD (attention deficit disorder) No Follow-up on file.

## 2015-11-15 NOTE — Progress Notes (Signed)
Pre visit review using our clinic review tool, if applicable. No additional management support is needed unless otherwise documented below in the visit note. 

## 2015-11-15 NOTE — Assessment & Plan Note (Signed)
Improving. Decrease Adderall XR dose to 20 mg twice daily. Call or return to clinic prn if these symptoms worsen or fail to improve as anticipated.

## 2015-11-15 NOTE — Addendum Note (Signed)
Addended by: Sydell AxonLAWS, Lewanda Perea C on: 11/15/2015 10:16 AM   Modules accepted: Orders

## 2015-11-17 LAB — TB SKIN TEST
Induration: 0 mm
TB SKIN TEST: NEGATIVE

## 2015-11-23 ENCOUNTER — Emergency Department: Payer: 59

## 2015-11-23 ENCOUNTER — Emergency Department
Admission: EM | Admit: 2015-11-23 | Discharge: 2015-11-23 | Disposition: A | Discharge status: Home / Self Care | Payer: 59 | Attending: Emergency Medicine | Admitting: Emergency Medicine

## 2015-11-23 ENCOUNTER — Encounter: Payer: Self-pay | Admitting: Emergency Medicine

## 2015-11-23 DIAGNOSIS — Z79899 Other long term (current) drug therapy: Secondary | ICD-10-CM | POA: Insufficient documentation

## 2015-11-23 DIAGNOSIS — R42 Dizziness and giddiness: Secondary | ICD-10-CM | POA: Insufficient documentation

## 2015-11-23 DIAGNOSIS — M546 Pain in thoracic spine: Secondary | ICD-10-CM

## 2015-11-23 DIAGNOSIS — R079 Chest pain, unspecified: Secondary | ICD-10-CM

## 2015-11-23 DIAGNOSIS — Z7951 Long term (current) use of inhaled steroids: Secondary | ICD-10-CM | POA: Insufficient documentation

## 2015-11-23 DIAGNOSIS — R0602 Shortness of breath: Secondary | ICD-10-CM | POA: Insufficient documentation

## 2015-11-23 DIAGNOSIS — I10 Essential (primary) hypertension: Secondary | ICD-10-CM | POA: Insufficient documentation

## 2015-11-23 HISTORY — DX: Attention-deficit hyperactivity disorder, unspecified type: F90.9

## 2015-11-23 LAB — TROPONIN I: Troponin I: <0.03 ng/mL (ref <0.031)

## 2015-11-23 LAB — CBC
HCT: 38.3 % (ref 35.0–47.0)
HEMOGLOBIN: 12.5 g/dL (ref 12.0–16.0)
MCH: 26.4 pg (ref 26.0–34.0)
MCHC: 32.8 g/dL (ref 32.0–36.0)
MCV: 80.7 fL (ref 80.0–100.0)
Platelets: 327 10*3/uL (ref 150–440)
RBC: 4.75 MIL/uL (ref 3.80–5.20)
RDW: 14.2 % (ref 11.5–14.5)
WBC: 11.1 10*3/uL — ABNORMAL HIGH (ref 3.6–11.0)

## 2015-11-23 LAB — BASIC METABOLIC PANEL
ANION GAP: 7 (ref 5–15)
BUN: 21 mg/dL — ABNORMAL HIGH (ref 6–20)
CALCIUM: 9.4 mg/dL (ref 8.9–10.3)
CHLORIDE: 103 mmol/L (ref 101–111)
CO2: 29 mmol/L (ref 22–32)
Creatinine, Ser: 0.72 mg/dL (ref 0.44–1.00)
GFR calc non Af Amer: >60 mL/min (ref >60)
Glucose, Bld: 117 mg/dL — ABNORMAL HIGH (ref 65–99)
Potassium: 3.8 mmol/L (ref 3.5–5.1)
Sodium: 139 mmol/L (ref 135–145)

## 2015-11-23 MED ORDER — GI COCKTAIL ~~LOC~~
30.0000 mL | Freq: Once | ORAL | Status: AC
  Administered 2015-11-23: 30 mL via ORAL
  Filled 2015-11-23: qty 30

## 2015-11-23 MED ORDER — SODIUM CHLORIDE 0.9 % IV BOLUS (SEPSIS)
500.0000 mL | Freq: Once | INTRAVENOUS | Status: AC
  Administered 2015-11-23: 500 mL via INTRAVENOUS

## 2015-11-23 MED ORDER — IOHEXOL 350 MG/ML SOLN
100.0000 mL | Freq: Once | INTRAVENOUS | Status: AC | PRN
  Administered 2015-11-23: 100 mL via INTRAVENOUS

## 2015-11-23 MED ORDER — SUCRALFATE 1 G PO TABS: 1.0000 g | ORAL_TABLET | Freq: Two times a day (BID) | ORAL | Status: DC

## 2015-11-23 NOTE — Discharge Instructions (Signed)
Nonspecific Chest Pain  °Chest pain can be caused by many different conditions. There is always a chance that your pain could be related to something serious, such as a heart attack or a blood clot in your lungs. Chest pain can also be caused by conditions that are not life-threatening. If you have chest pain, it is very important to follow up with your health care provider. °CAUSES  °Chest pain can be caused by: °· Heartburn. °· Pneumonia or bronchitis. °· Anxiety or stress. °· Inflammation around your heart (pericarditis) or lung (pleuritis or pleurisy). °· A blood clot in your lung. °· A collapsed lung (pneumothorax). It can develop suddenly on its own (spontaneous pneumothorax) or from trauma to the chest. °· Shingles infection (varicella-zoster virus). °· Heart attack. °· Damage to the bones, muscles, and cartilage that make up your chest wall. This can include: °¨ Bruised bones due to injury. °¨ Strained muscles or cartilage due to frequent or repeated coughing or overwork. °¨ Fracture to one or more ribs. °¨ Sore cartilage due to inflammation (costochondritis). °RISK FACTORS  °Risk factors for chest pain may include: °· Activities that increase your risk for trauma or injury to your chest. °· Respiratory infections or conditions that cause frequent coughing. °· Medical conditions or overeating that can cause heartburn. °· Heart disease or family history of heart disease. °· Conditions or health behaviors that increase your risk of developing a blood clot. °· Having had chicken pox (varicella zoster). °SIGNS AND SYMPTOMS °Chest pain can feel like: °· Burning or tingling on the surface of your chest or deep in your chest. °· Crushing, pressure, aching, or squeezing pain. °· Dull or sharp pain that is worse when you move, cough, or take a deep breath. °· Pain that is also felt in your back, neck, shoulder, or arm, or pain that spreads to any of these areas. °Your chest pain may come and go, or it may stay  constant. °DIAGNOSIS °Lab tests or other studies may be needed to find the cause of your pain. Your health care provider may have you take a test called an ambulatory ECG (electrocardiogram). An ECG records your heartbeat patterns at the time the test is performed. You may also have other tests, such as: °· Transthoracic echocardiogram (TTE). During echocardiography, sound waves are used to create a picture of all of the heart structures and to look at how blood flows through your heart. °· Transesophageal echocardiogram (TEE). This is a more advanced imaging test that obtains images from inside your body. It allows your health care provider to see your heart in finer detail. °· Cardiac monitoring. This allows your health care provider to monitor your heart rate and rhythm in real time. °· Holter monitor. This is a portable device that records your heartbeat and can help to diagnose abnormal heartbeats. It allows your health care provider to track your heart activity for several days, if needed. °· Stress tests. These can be done through exercise or by taking medicine that makes your heart beat more quickly. °· Blood tests. °· Imaging tests. °TREATMENT  °Your treatment depends on what is causing your chest pain. Treatment may include: °· Medicines. These may include: °¨ Acid blockers for heartburn. °¨ Anti-inflammatory medicine. °¨ Pain medicine for inflammatory conditions. °¨ Antibiotic medicine, if an infection is present. °¨ Medicines to dissolve blood clots. °¨ Medicines to treat coronary artery disease. °· Supportive care for conditions that do not require medicines. This may include: °¨ Resting. °¨ Applying heat   or cold packs to injured areas.  Limiting activities until pain decreases. HOME CARE INSTRUCTIONS  If you were prescribed an antibiotic medicine, finish it all even if you start to feel better.  Avoid any activities that bring on chest pain.  Do not use any tobacco products, including  cigarettes, chewing tobacco, or electronic cigarettes. If you need help quitting, ask your health care provider.  Do not drink alcohol.  Take medicines only as directed by your health care provider.  Keep all follow-up visits as directed by your health care provider. This is important. This includes any further testing if your chest pain does not go away.  If heartburn is the cause for your chest pain, you may be told to keep your head raised (elevated) while sleeping. This reduces the chance that acid will go from your stomach into your esophagus.  Make lifestyle changes as directed by your health care provider. These may include:  Getting regular exercise. Ask your health care provider to suggest some activities that are safe for you.  Eating a heart-healthy diet. A registered dietitian can help you to learn healthy eating options.  Maintaining a healthy weight.  Managing diabetes, if necessary.  Reducing stress. SEEK MEDICAL CARE IF:  Your chest pain does not go away after treatment.  You have a rash with blisters on your chest.  You have a fever. SEEK IMMEDIATE MEDICAL CARE IF:   Your chest pain is worse.  You have an increasing cough, or you cough up blood.  You have severe abdominal pain.  You have severe weakness.  You faint.  You have chills.  You have sudden, unexplained chest discomfort.  You have sudden, unexplained discomfort in your arms, back, neck, or jaw.  You have shortness of breath at any time.  You suddenly start to sweat, or your skin gets clammy.  You feel nauseous or you vomit.  You suddenly feel light-headed or dizzy.  Your heart begins to beat quickly, or it feels like it is skipping beats. These symptoms may represent a serious problem that is an emergency. Do not wait to see if the symptoms will go away. Get medical help right away. Call your local emergency services (911 in the [place] Do not drive yourself to the hospital.   This  information is not intended to replace advice given to you by your health care provider. Make sure you discuss any questions you have with your health care provider.   Document Released: 08/22/2005 Document Revised: 12/03/2014 Document Reviewed: 06/18/2014 Elsevier Interactive Patient Education 2016 [employer]  Back Pain, Adult Back pain is very common. The pain often gets better over time. The cause of back pain is usually not dangerous. Most people can learn to manage their back pain on their own.  HOME CARE  Watch your back pain for any changes. The following actions may help to lessen any pain you are feeling:  Stay active. Start with short walks on flat ground if you can. Try to walk farther each day.  Exercise regularly as told by your doctor. Exercise helps your back heal faster. It also helps avoid future injury by keeping your muscles strong and flexible.  Do not sit, drive, or stand in one place for more than 30 minutes.  Do not stay in bed. Resting more than 1-2 days can slow down your recovery.  Be careful when you bend or lift an object. Use good form when lifting:  Bend at your knees.  Keep the  object close to your body.  Do not twist.  Sleep on a firm mattress. Lie on your side, and bend your knees. If you lie on your back, put a pillow under your knees.  Take medicines only as told by your doctor.  Put ice on the injured area.  Put ice in a plastic bag.  Place a towel between your skin and the bag.  Leave the ice on for 20 minutes, 2-3 times a day for the first 2-3 days. After that, you can switch between ice and heat packs.  Avoid feeling anxious or stressed. Find good ways to deal with stress, such as exercise.  Maintain a healthy weight. Extra weight puts stress on your back. GET HELP IF:   You have pain that does not go away with rest or medicine.  You have worsening pain that goes down into your legs or buttocks.  You have pain that does not get  better in one week.  You have pain at night.  You lose weight.  You have a fever or chills. GET HELP RIGHT AWAY IF:   You cannot control when you poop (bowel movement) or pee (urinate).  Your arms or legs feel weak.  Your arms or legs lose feeling (numbness).  You feel sick to your stomach (nauseous) or throw up (vomit).  You have belly (abdominal) pain.  You feel like you may pass out (faint).   This information is not intended to replace advice given to you by your health care provider. Make sure you discuss any questions you have with your health care provider.   Document Released: 04/30/2008 Document Revised: 12/03/2014 Document Reviewed: 03/16/2014 Elsevier Interactive Patient Education [employer]

## 2015-11-23 NOTE — ED Notes (Signed)
Patient transported back to treatment room.

## 2015-11-23 NOTE — ED Provider Notes (Signed)
[facility] Emergency Department Provider Note  ____________________________________________  Time seen: Approximately 320 AM  I have reviewed the triage vital signs and the nursing notes.   HISTORY  Chief Complaint Chest Pain    HPI Michelle Hoffman is a 59 y.o. female who comes into the hospital today with chest pain. The patient reports that she had some sharp pain that started from the left side of her back into her chest. She reports it was initially sharp and now it feels like a squeezing. The pain has been coming and going. She reports that a couple of hours ago it seemed like it was near her shoulder blade close to between her shoulder blades. The patient felt little dizzy so she became concerned. The patient has had 2 friends who are her same age died from heart attacks. The patient's father has also had a history of stroke and A. fib. The patient had some mild difficulty breathing but did not feel short of breath. She denies nausea or vomiting. She also did not eat all of her dinner which is not usual for her. She's had some heartburn and indigestion for the last couple of days and has been using milk and Tums to help the symptoms. The patient denies sweats as well. Given the symptoms the patient and her daughter sided to come in for evaluation.The patient rates her pain as a 3 out of 10 in intensity.   Past Medical History  Diagnosis Date  . Hypertension   . GERD (gastroesophageal reflux disease)   . Allergy   . ADHD (attention deficit hyperactivity disorder)     Patient Active Problem List   Diagnosis Date Noted  . ADD (attention deficit disorder) 11/16/2014  . LATERAL EPICONDYLITIS, LEFT 07/19/2010  . Essential hypertension 11/08/2009  . GERD 11/08/2009    Past Surgical History  Procedure Laterality Date  . Abdominal hysterectomy    . Carpal tunnel release    . Cesarean section    . Knee surgery    . Plantar fascia surgery       Current Outpatient Rx  Name  Route  Sig  Dispense  Refill  . amphetamine-dextroamphetamine (ADDERALL XR) 20 MG 24 hr capsule      Take one tablet by mouth twice daily   60 capsule   0   . calcium carbonate (TUMS) 500 MG chewable tablet   Oral   Chew 1 tablet by mouth as needed for indigestion or heartburn.         . fluticasone (FLONASE) 50 MCG/ACT nasal spray      instill 2 sprays into each nostril once daily   16 g   3   . ibuprofen (ADVIL,MOTRIN) 200 MG tablet   Oral   Take 200 mg by mouth every 6 (six) hours as needed.         . ranitidine (ZANTAC) 150 MG tablet      TAKE 1 TABLET BY MOUTH TWICE A DAY AS NEEDED FOR REFLUX   60 tablet   5   . sucralfate (CARAFATE) 1 g tablet   Oral   Take 1 tablet (1 g total) by mouth 2 (two) times daily.   20 tablet   0     Allergies Meloxicam and Sulfonamide derivatives  Family History  Problem Relation Age of Onset  . Cervical cancer Mother 84  . Stroke Father     in 42    Social History Social History  Substance Use Topics  .  Smoking status: Never Smoker   . Smokeless tobacco: Never Used  . Alcohol Use: No    Review of Systems Constitutional: No fever/chills Eyes: No visual changes. ENT: No sore throat. Cardiovascular:  chest pain. Respiratory: shortness of breath. Gastrointestinal: No abdominal pain.  No nausea, no vomiting.  No diarrhea.  No constipation. Genitourinary: Negative for dysuria. Musculoskeletal:  back pain. Skin: Negative for rash. Neurological: Negative for headaches, focal weakness or numbness.  10-point ROS otherwise negative.  ____________________________________________   PHYSICAL EXAM:  VITAL SIGNS: ED Triage Vitals  Enc Vitals Group     BP 11/23/15 0107 136/84 mmHg     Pulse Rate 11/23/15 0107 89     Resp 11/23/15 0107 18     Temp 11/23/15 0107 98.2 F (36.8 C)     Temp Source 11/23/15 0107 Oral     SpO2 11/23/15 0107 99 %     Weight 11/23/15 0107 185 lb  (83.915 kg)     Height 11/23/15 0107 5' (1.524 m)     Head Cir --      Peak Flow --      Pain Score 11/23/15 0107 4     Pain Loc --      Pain Edu? --      Excl. in GC? --     Constitutional: Alert and oriented. Well appearing and in mild to moderate distress. Eyes: Conjunctivae are normal. PERRL. EOMI. Head: Atraumatic. Nose: No congestion/rhinnorhea. Mouth/Throat: Mucous membranes are moist.  Oropharynx non-erythematous. Cardiovascular: Normal rate, regular rhythm. Grossly normal heart sounds.  Good peripheral circulation. Respiratory: Normal respiratory effort.  No retractions. Lungs CTAB. Gastrointestinal: Soft and nontender. No distention. Positive bowel sounds Musculoskeletal: No lower extremity tenderness nor edema.   Neurologic:  Normal speech and language. No gross focal neurologic deficits are appreciated.  Skin:  Skin is warm, dry and intact.  Psychiatric: Mood and affect are normal.   ____________________________________________   LABS (all labs ordered are listed, but only abnormal results are displayed)  Labs Reviewed  BASIC METABOLIC PANEL - Abnormal; Notable for the following:    Glucose, Bld 117 (*)    BUN 21 (*)    All other components within normal limits  CBC - Abnormal; Notable for the following:    WBC 11.1 (*)    All other components within normal limits  TROPONIN I  TROPONIN I   ____________________________________________  EKG  ED ECG REPORT I, Rebecka ApleyWebster,  Cohen Doleman P, the attending physician, personally viewed and interpreted this ECG.   Date: 11/23/2015  EKG Time: 102  Rate: 85  Rhythm: normal sinus rhythm  Axis: Normal  Intervals:none  ST&T Change: None  ____________________________________________  RADIOLOGY  Chest x-ray: No active cardiopulmonary disease  CT angio chest: No evidence of significant pulmonary embolus, no evidence of aortic dissection, lungs clear  bilaterally. ____________________________________________   PROCEDURES  Procedure(s) performed: None  Critical Care performed: No  ____________________________________________   INITIAL IMPRESSION / ASSESSMENT AND PLAN / ED COURSE  Pertinent labs & imaging results that were available during my care of the patient were reviewed by me and considered in my medical decision making (see chart for details).  This is a 59 year old female who comes in with some pain in her left back as well as in her chest. The patient reports this been coming and going and she was scared that she may be having a heart attack. I will give the patient a GI cocktail and see if that helps her symptoms.  -----------------------------------------  6:43 AM on 11/23/2015 -----------------------------------------  The patient reports that she still had pain near her shoulder blade but the pain in her chest and the left side of her chest was improved. The patient had a CT of her chest that did not show any aortic dissection which was the biggest concern. She also had a repeat troponin that was negative. I will discharge the patient home and have her follow back up with her primary care physician. The patient agrees with the plan and understands to follow up for a stress test. The patient otherwise has well-appearing vitals and has no acute distress at this time. She'll be discharged home. ____________________________________________   FINAL CLINICAL IMPRESSION(S) / ED DIAGNOSES  Final diagnoses:  Chest pain, unspecified chest pain type  Left-sided thoracic back pain      Rebecka Apley, MD 11/23/15 [phone]

## 2015-11-23 NOTE — ED Notes (Signed)
Pt presents to ED with left sided chest paint that radiates to her left arm. Pt states her pain started around 2230 while she was at work. Pain is intermittent in nature and feels like heaviness and nagging. Denies similar symptoms previously. Feels slightly short of breath occassionally and "swimmy headed" at times but states she doesn't feel like she is going to "pass out". Put currently has no increased work of breathing or acute distress noted at this time.

## 2015-11-23 NOTE — ED Notes (Signed)

## 2015-11-23 NOTE — ED Notes (Signed)
Patient transported to CT via stretcher.

## 2015-11-23 NOTE — ED Notes (Signed)
Pt presents to ED with c/o left sided chest pain radiating to back, pt states "I don't really have shortness of breath but it felt harder to breath." Pt denies nausea or vomiting, fevers, abdominal pain, or diaphoresis. Pt denies blurry vision or headache. Pt states pain began at around 10 pm last night while at work, reports she was sitting down when pain began. Pt alert and oriented x 4, no increased work in breathing noted, skin warm and dry, speaking in complete sentences. Dr. Zenda AlpersWebster at bedside.

## 2015-12-16 ENCOUNTER — Other Ambulatory Visit: Payer: Self-pay

## 2015-12-16 NOTE — Telephone Encounter (Signed)
Pt left v/m requesting rx for Adderall. Call when ready for pick up. Pt last seen and rx last printed # 60 on 11/15/15. FYI pt has changed ins to [facility] and pt is not sure if will need prior auth or not and pt is using [facility] again.

## 2015-12-16 NOTE — Telephone Encounter (Signed)
Ok to print and put in my box for signature. 

## 2015-12-19 MED ORDER — AMPHETAMINE-DEXTROAMPHET ER 20 MG PO CP24: ORAL_CAPSULE | ORAL | Status: DC

## 2015-12-19 NOTE — Telephone Encounter (Signed)
Lm on pts vm and informed her Rx is available for pickup from the front desk 

## 2015-12-26 ENCOUNTER — Telehealth: Payer: Self-pay | Admitting: *Deleted

## 2015-12-26 NOTE — Telephone Encounter (Signed)
Patient called stating that she went to the pharmacy to pick up her Adderall and was advised that she needs a PA on this medication. Patient stated that she has changed her insurance company. Advised patient to have her pharmacy send over the form requesting a PA so that all of her updated information is on it. Patient stated that she will call her pharmacy now and have them send over the request.

## 2015-12-26 NOTE — Telephone Encounter (Signed)
Form received and indicates this medication is a 24hr cap and should only be taken once daily. Quantity max is #30 for 24hr med

## 2015-12-28 NOTE — Telephone Encounter (Signed)
Pt left v/m requesting status of adderall PA. Pt request cb 12/28/15. Pt is out of med.

## 2015-12-29 MED ORDER — AMPHETAMINE-DEXTROAMPHET ER 20 MG PO CP24: ORAL_CAPSULE | ORAL | Status: DC

## 2015-12-29 NOTE — Addendum Note (Signed)
Addended by: Desmond Dike on: 12/29/2015 01:01 PM   Modules accepted: Orders

## 2015-12-29 NOTE — Telephone Encounter (Signed)
Noted . Please inform pt

## 2015-12-29 NOTE — Telephone Encounter (Signed)
Lm on pts vm requesting a call back 

## 2015-12-29 NOTE — Telephone Encounter (Signed)
Fax received from pts insurance stating her max is #30 per month. She is on a 24hr tab, which they state should not be taken BID. See message below

## 2015-12-29 NOTE — Telephone Encounter (Signed)
Spoke to pt and advised per insurance. Pt states she is wanting to reduce to 1  24 hr tab. New Rx printed to reflect. Pt will bring old Rx when picking up new one

## 2015-12-30 NOTE — Telephone Encounter (Signed)
Pt bought in old rx, I put in the shred bin.

## 2016-01-02 ENCOUNTER — Ambulatory Visit: Payer: Self-pay | Admitting: Family Medicine

## 2016-01-02 ENCOUNTER — Ambulatory Visit (INDEPENDENT_AMBULATORY_CARE_PROVIDER_SITE_OTHER): Payer: BLUE CROSS/BLUE SHIELD | Admitting: Family Medicine

## 2016-01-02 ENCOUNTER — Encounter: Payer: Self-pay | Admitting: Family Medicine

## 2016-01-02 ENCOUNTER — Telehealth: Payer: Self-pay

## 2016-01-02 VITALS — BP 124/66 | HR 88 | Temp 98.3°F | Wt 194.8 lb

## 2016-01-02 DIAGNOSIS — J0110 Acute frontal sinusitis, unspecified: Secondary | ICD-10-CM | POA: Diagnosis not present

## 2016-01-02 MED ORDER — AMOXICILLIN 875 MG PO TABS: 875.0000 mg | ORAL_TABLET | Freq: Two times a day (BID) | ORAL | Status: DC

## 2016-01-02 MED ORDER — FLUCONAZOLE 150 MG PO TABS: 150.0000 mg | ORAL_TABLET | Freq: Once | ORAL | Status: AC

## 2016-01-02 NOTE — Progress Notes (Signed)
Pre visit review using our clinic review tool, if applicable. No additional management support is needed unless otherwise documented below in the visit note. 

## 2016-01-02 NOTE — Progress Notes (Signed)
SUBJECTIVE:  Michelle Hoffman is a 60 y.o. female who complains of coryza, congestion, nasal blockage and bilateral sinus pain for 20 days. She denies a history of anorexia and chest pain and denies a history of asthma. Patient denies smoke cigarettes.   Current Outpatient Prescriptions on File Prior to Visit  Medication Sig Dispense Refill  . amphetamine-dextroamphetamine (ADDERALL XR) 20 MG 24 hr capsule Take one tablet by mouth daily 30 capsule 0  . calcium carbonate (TUMS) 500 MG chewable tablet Chew 1 tablet by mouth as needed for indigestion or heartburn.    . fluticasone (FLONASE) 50 MCG/ACT nasal spray instill 2 sprays into each nostril once daily 16 g 3  . ibuprofen (ADVIL,MOTRIN) 200 MG tablet Take 200 mg by mouth every 6 (six) hours as needed.     No current facility-administered medications on file prior to visit.    Allergies  Allergen Reactions  . Meloxicam     REACTION: Tongue burns  . Sulfonamide Derivatives     REACTION: Rash    Past Medical History  Diagnosis Date  . Hypertension   . GERD (gastroesophageal reflux disease)   . Allergy   . ADHD (attention deficit hyperactivity disorder)     Past Surgical History  Procedure Laterality Date  . Abdominal hysterectomy    . Carpal tunnel release    . Cesarean section    . Knee surgery    . Plantar fascia surgery      Family History  Problem Relation Age of Onset  . Cervical cancer Mother 4  . Stroke Father     in 87    Social History   Social History  . Marital Status: Married    Spouse Name: N/A  . Number of Children: 3  . Years of Education: N/A   Occupational History  . 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 Smoking status: Never Smoker   . Smokeless tobacco: Never Used  . Alcohol Use: No  . Drug Use: No  . Sexual Activity: Not on file   Other Topics Concern  . Not on file   Social History Narrative   Lives with husband and 3 children [phone])    The PMH, PSH, Social History, Family History, Medications, and allergies have been reviewed in [facility], and have been updated if relevant.  OBJECTIVE: BP 124/66 mmHg  Pulse 88  Temp(Src) 98.3 F (36.8 C) (Oral)  Wt 194 lb 12 oz (88.338 kg)  SpO2 97%  She appears well, vital signs are as noted. Ears normal.  Throat and pharynx normal.  Neck supple. No adenopathy in the neck. Nose is congested. Sinuses  tender. The chest is clear, without wheezes or rales.  ASSESSMENT:  sinusitis  PLAN: Given duration and progression of symptoms, will treat for bacterial sinusitis with amoxicillin.  Symptomatic therapy suggested: push fluids, rest and return office visit prn if symptoms persist or worsen.  Call or return to clinic prn if these symptoms worsen or fail to improve as anticipated.

## 2016-01-02 NOTE — Patient Instructions (Signed)
Take antibiotic as directed.  Drink lots of fluids.    Treat sympotmatically with Mucinex, nasal saline irrigation, and Tylenol/Ibuprofen.   Also try an antihistamine/decongestant like claritin D or zyrtec D over the counter- two times a day as needed ( have to sign for them at pharmacy).   Try over the counter nasocort-start with 2 sprays per nostril per day...and then try to taper to 1 spray per nostril once symptoms improve.   You can use warm compresses.    Call if not improving as expected in 5-7 days.    

## 2016-01-02 NOTE — Telephone Encounter (Signed)
eRx sent

## 2016-01-02 NOTE — Telephone Encounter (Signed)
Pt left v/m; pt was seen earlier today and pt given abx; pt request rx for [facility]lucan to [facility]. Pt always gets yeast infection after taking abx.Please advise.

## 2016-01-20 ENCOUNTER — Other Ambulatory Visit: Payer: Self-pay

## 2016-01-20 NOTE — Telephone Encounter (Signed)
Pt left v/m requesting rx for Adderall. Call when ready for pick up. rx last printed # 30 on 12/29/15. Last seen med refill 11/15/15.

## 2016-01-23 MED ORDER — AMPHETAMINE-DEXTROAMPHET ER 20 MG PO CP24: ORAL_CAPSULE | ORAL | Status: DC

## 2016-01-23 NOTE — Telephone Encounter (Signed)
Lm on pts vm and informed her Rx is available for pickup from the front desk 

## 2016-02-20 ENCOUNTER — Other Ambulatory Visit: Payer: Self-pay

## 2016-02-20 MED ORDER — AMPHETAMINE-DEXTROAMPHET ER 20 MG PO CP24: ORAL_CAPSULE | ORAL | Status: DC

## 2016-02-20 NOTE — Telephone Encounter (Signed)
Pt left v/m requesting rx for Adderall. Call when ready for pick up. rx last printed # 30 on 01/23/16; last seen for med refill on 11/15/15. Pt request to pick up on 02/21/16.

## 2016-02-20 NOTE — Telephone Encounter (Signed)
Spoke to pt and informed her Rx is available for pickup from the front desk 

## 2016-03-07 ENCOUNTER — Ambulatory Visit: Payer: Self-pay | Admitting: Family Medicine

## 2016-03-20 ENCOUNTER — Other Ambulatory Visit: Payer: Self-pay

## 2016-03-20 MED ORDER — AMPHETAMINE-DEXTROAMPHET ER 20 MG PO CP24: ORAL_CAPSULE | ORAL | Status: DC

## 2016-03-20 NOTE — Telephone Encounter (Signed)
Lm on pts vm and informed her Rx is available for pickup from the front desk 

## 2016-03-20 NOTE — Telephone Encounter (Signed)
Pt left v/m requesting rx for Adderall. Call when ready for pick up. rx last printed # 30 on 02/20/16; last seen 11/15/15.

## 2016-03-26 ENCOUNTER — Ambulatory Visit: Payer: Self-pay | Admitting: Family Medicine

## 2016-04-05 ENCOUNTER — Other Ambulatory Visit: Payer: Self-pay | Admitting: Family Medicine

## 2016-04-05 DIAGNOSIS — Z1231 Encounter for screening mammogram for malignant neoplasm of breast: Secondary | ICD-10-CM

## 2016-04-16 ENCOUNTER — Ambulatory Visit (INDEPENDENT_AMBULATORY_CARE_PROVIDER_SITE_OTHER): Payer: BLUE CROSS/BLUE SHIELD | Admitting: Family Medicine

## 2016-04-16 ENCOUNTER — Encounter: Payer: Self-pay | Admitting: Family Medicine

## 2016-04-16 VITALS — BP 128/82 | HR 95 | Temp 97.8°F | Wt 197.5 lb

## 2016-04-16 DIAGNOSIS — R2232 Localized swelling, mass and lump, left upper limb: Secondary | ICD-10-CM | POA: Diagnosis not present

## 2016-04-16 DIAGNOSIS — R2230 Localized swelling, mass and lump, unspecified upper limb: Secondary | ICD-10-CM | POA: Insufficient documentation

## 2016-04-16 NOTE — Progress Notes (Signed)
Subjective:   Patient ID: Michelle Hoffman, female    DOB: May 25, 1956, 60 y.o.   MRN: [id number]  Michelle Hoffman is a pleasant 60 y.o. year old female who presents to clinic today with Cyst  on 04/16/2016  HPI:  Left arm ?cyst- Noticed it about a month ago but becoming more painful, especially when she bends her elbow or lifts heavy objects.  She is not sure if it has grown in size. No erythema or warmth.  Current Outpatient Prescriptions on File Prior to Visit  Medication Sig Dispense Refill  . amphetamine-dextroamphetamine (ADDERALL XR) 20 MG 24 hr capsule Take one tablet by mouth daily 30 capsule 0  . calcium carbonate (TUMS) 500 MG chewable tablet Chew 1 tablet by mouth as needed for indigestion or heartburn.    . fluticasone (FLONASE) 50 MCG/ACT nasal spray instill 2 sprays into each nostril once daily 16 g 3  . ibuprofen (ADVIL,MOTRIN) 200 MG tablet Take 200 mg by mouth every 6 (six) hours as needed.     No current facility-administered medications on file prior to visit.    Allergies  Allergen Reactions  . Meloxicam     REACTION: Tongue burns  . Sulfonamide Derivatives     REACTION: Rash    Past Medical History  Diagnosis Date  . Hypertension   . GERD (gastroesophageal reflux disease)   . Allergy   . ADHD (attention deficit hyperactivity disorder)     Past Surgical History  Procedure Laterality Date  . Abdominal hysterectomy    . Carpal tunnel release    . Cesarean section    . Knee surgery    . Plantar fascia surgery      Family History  Problem Relation Age of Onset  . Cervical cancer Mother 65  . Stroke Father     in 61    Social History   Social History  . Marital Status: Married    Spouse Name: N/A  . Number of Children: 3  . Years of Education: N/A   Occupational History  . 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 Smoking status: Never Smoker   . Smokeless tobacco: Never Used  . Alcohol Use: No    . Drug Use: No  . Sexual Activity: Not on file   Other Topics Concern  . Not on file   Social History Narrative   Lives with husband and 3 children [phone])   The PMH, PSH, Social History, Family History, Medications, and allergies have been reviewed in [facility], and have been updated if relevant.   Review of Systems  Constitutional: Negative.   Skin: Negative.   Allergic/Immunologic: Negative.   Neurological: Negative.   All other systems reviewed and are negative.      Objective:    BP 128/82 mmHg  Pulse 95  Temp(Src) 97.8 F (36.6 C) (Oral)  Wt 197 lb 8 oz (89.585 kg)  SpO2 98%   Physical Exam  Constitutional: She is oriented to person, place, and time. She appears well-developed and well-nourished. No distress.  HENT:  Head: Normocephalic.  Eyes: Conjunctivae are normal.  Cardiovascular: Normal rate.   Musculoskeletal: Normal range of motion.       Arms: Neurological: She is alert and oriented to person, place, and time. No cranial nerve deficit.  Skin: She is not diaphoretic.  Psychiatric: She has a normal mood and affect. Her behavior is normal. Judgment and thought content normal.  Nursing  note and vitals reviewed.         Assessment & Plan:   Arm mass, left - Plan: US Misc Soft Tissue No Follow-up on file.

## 2016-04-16 NOTE — Assessment & Plan Note (Signed)
New- cyst vs mass/lymph node. US for further evaluation. The patient indicates understanding of these issues and agrees with the plan.

## 2016-04-16 NOTE — Patient Instructions (Signed)
Good to see you. Please stop by to see Michelle Hoffman on your way out. 

## 2016-04-16 NOTE — Progress Notes (Signed)
Pre visit review using our clinic review tool, if applicable. No additional management support is needed unless otherwise documented below in the visit note. 

## 2016-04-16 NOTE — Addendum Note (Signed)
Addended by: Desmond DikeKNIGHT, Hjalmer Iovino H on: 04/16/2016 10:26 AM   Modules accepted: Orders

## 2016-04-18 ENCOUNTER — Ambulatory Visit
Admission: RE | Admit: 2016-04-18 | Discharge: 2016-04-18 | Disposition: A | Discharge status: Home / Self Care | Payer: BLUE CROSS/BLUE SHIELD | Source: Ambulatory Visit | Attending: Family Medicine | Admitting: Family Medicine

## 2016-04-18 DIAGNOSIS — Z1231 Encounter for screening mammogram for malignant neoplasm of breast: Secondary | ICD-10-CM | POA: Diagnosis present

## 2016-04-19 ENCOUNTER — Other Ambulatory Visit: Payer: Self-pay | Admitting: Family Medicine

## 2016-04-19 DIAGNOSIS — R928 Other abnormal and inconclusive findings on diagnostic imaging of breast: Secondary | ICD-10-CM

## 2016-04-20 ENCOUNTER — Ambulatory Visit
Admission: RE | Admit: 2016-04-20 | Discharge: 2016-04-20 | Disposition: A | Discharge status: Home / Self Care | Payer: BLUE CROSS/BLUE SHIELD | Source: Ambulatory Visit | Attending: Family Medicine | Admitting: Family Medicine

## 2016-04-20 DIAGNOSIS — R2232 Localized swelling, mass and lump, left upper limb: Secondary | ICD-10-CM | POA: Diagnosis present

## 2016-04-24 ENCOUNTER — Telehealth: Payer: Self-pay | Admitting: Family Medicine

## 2016-04-24 DIAGNOSIS — R2230 Localized swelling, mass and lump, unspecified upper limb: Secondary | ICD-10-CM

## 2016-04-24 MED ORDER — AMPHETAMINE-DEXTROAMPHET ER 20 MG PO CP24: ORAL_CAPSULE | ORAL | Status: DC

## 2016-04-24 NOTE — Telephone Encounter (Signed)
Spoke to pt and informed her Rx is available for pickup from the front desk;advised third party unable to pickup.  Advised of results, and pt is agreeable to referral and was advised to await a call with appt details

## 2016-04-24 NOTE — Addendum Note (Signed)
Addended by: Dianne DunARON, Ellizabeth Dacruz M on: 04/24/2016 11:36 AM   Modules accepted: Orders

## 2016-04-24 NOTE — Telephone Encounter (Signed)
Adderall rx printed. See result note- US was neg. At this point, the next step would be a referral to dermatology.  Would she like for me to place the referral?

## 2016-04-24 NOTE — Telephone Encounter (Signed)
Referral placed.

## 2016-04-24 NOTE — Telephone Encounter (Signed)
Pt went to have the ultrasound and she got the results that there were no findings on her MyChart.  She needs to know what to do next because she is still in pain.  Pt also needs her adderall 20mg  refilled

## 2016-04-25 ENCOUNTER — Ambulatory Visit
Admission: RE | Admit: 2016-04-25 | Discharge: 2016-04-25 | Disposition: A | Discharge status: Home / Self Care | Payer: BLUE CROSS/BLUE SHIELD | Source: Ambulatory Visit | Attending: Family Medicine | Admitting: Family Medicine

## 2016-04-25 ENCOUNTER — Encounter: Payer: Self-pay | Admitting: Family Medicine

## 2016-04-25 DIAGNOSIS — R928 Other abnormal and inconclusive findings on diagnostic imaging of breast: Secondary | ICD-10-CM | POA: Diagnosis present

## 2016-04-25 DIAGNOSIS — N63 Unspecified lump in breast: Secondary | ICD-10-CM | POA: Insufficient documentation

## 2016-05-02 ENCOUNTER — Other Ambulatory Visit: Payer: Self-pay | Admitting: Family Medicine

## 2016-05-02 ENCOUNTER — Encounter: Payer: Self-pay | Admitting: Family Medicine

## 2016-05-23 ENCOUNTER — Other Ambulatory Visit: Payer: Self-pay

## 2016-05-23 MED ORDER — AMPHETAMINE-DEXTROAMPHET ER 20 MG PO CP24: ORAL_CAPSULE | ORAL | Status: DC

## 2016-05-23 NOTE — Telephone Encounter (Signed)
Pt left v/m requesting rx for Adderall. Call when ready for pick up. rx last printed # 30 on 04/24/16. Last f/u seen 11/15/15. Pt has 2 pills left.

## 2016-05-23 NOTE — Telephone Encounter (Signed)
Spoke to pt and informed her Rx is available for pickup from the front desk 

## 2016-06-06 ENCOUNTER — Encounter: Payer: Self-pay | Admitting: Family Medicine

## 2016-06-06 ENCOUNTER — Other Ambulatory Visit: Payer: Self-pay | Admitting: Family Medicine

## 2016-06-06 ENCOUNTER — Ambulatory Visit (INDEPENDENT_AMBULATORY_CARE_PROVIDER_SITE_OTHER)
Admission: RE | Admit: 2016-06-06 | Discharge: 2016-06-06 | Disposition: A | Discharge status: Home / Self Care | Payer: BLUE CROSS/BLUE SHIELD | Source: Ambulatory Visit | Attending: Family Medicine | Admitting: Family Medicine

## 2016-06-06 ENCOUNTER — Ambulatory Visit (INDEPENDENT_AMBULATORY_CARE_PROVIDER_SITE_OTHER): Payer: BLUE CROSS/BLUE SHIELD | Admitting: Family Medicine

## 2016-06-06 VITALS — BP 142/80 | HR 90 | Temp 98.0°F | Wt 193.5 lb

## 2016-06-06 DIAGNOSIS — M25561 Pain in right knee: Secondary | ICD-10-CM | POA: Diagnosis not present

## 2016-06-06 NOTE — Progress Notes (Signed)
SUBJECTIVE: Michelle Hoffman is a 60 y.o. female who complains of  right knee pain for the past week. .No known injury.  Pain is mostly behind her knee.  Also feels "tight and swollen."  No redness or warmth.  Symptoms have been constant since that time. Prior history of related problems: no prior problems with this area in the past.  Current Outpatient Prescriptions on File Prior to Visit  Medication Sig Dispense Refill  . amphetamine-dextroamphetamine (ADDERALL XR) 20 MG 24 hr capsule Take one tablet by mouth daily 30 capsule 0  . calcium carbonate (TUMS) 500 MG chewable tablet Chew 1 tablet by mouth as needed for indigestion or heartburn.    . fluticasone (FLONASE) 50 MCG/ACT nasal spray instill 2 sprays into each nostril once daily 16 g 3  . ibuprofen (ADVIL,MOTRIN) 200 MG tablet Take 200 mg by mouth every 6 (six) hours as needed.     No current facility-administered medications on file prior to visit.    Allergies  Allergen Reactions  . Meloxicam     REACTION: Tongue burns  . Sulfonamide Derivatives     REACTION: Rash    Past Medical History  Diagnosis Date  . Hypertension   . GERD (gastroesophageal reflux disease)   . Allergy   . ADHD (attention deficit hyperactivity disorder)     Past Surgical History  Procedure Laterality Date  . Abdominal hysterectomy    . Carpal tunnel release    . Cesarean section    . Knee surgery    . Plantar fascia surgery      Family History  Problem Relation Age of Onset  . Cervical cancer Mother [id number]  . Stroke Father     in [id number]  . Breast cancer Neg Hx     Social History   Social History  . Marital Status: Married    Spouse Name: N/A  . Number of Children: 3  . Years of Education: N/A   Occupational History  . 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 Smoking status: Never Smoker   . Smokeless tobacco: Never Used  . Alcohol Use: No  . Drug Use: No  . Sexual Activity: Not on file   Other  Topics Concern  . Not on file   Social History Narrative   Lives with husband and 3 children [phone](16,17,31)   The PMH, PSH, Social History, Family History, Medications, and allergies [facility], and have been updated if relevant.  OBJECTIVE: BP 142/80 mmHg  Pulse 90  Temp(Src) 98 F (36.7 C) (Oral)  Wt 193 lb 8 oz (87.771 kg)  SpO2 98%  Vital signs as noted above. Appearance: alert, well appearing, and in no distress and oriented to person, place, and time. Knee exam: effusion, reduced range of motion, negative McMurray sign. X-ray: ordered, but results not yet available.  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 See orders for this visit as documented in the electronic medical record.

## 2016-06-06 NOTE — Progress Notes (Signed)
Pre visit review using our clinic review tool, if applicable. No additional management support is needed unless otherwise documented below in the visit note. 

## 2016-06-06 NOTE — Patient Instructions (Addendum)
Good to see you.  I will call you with your xray results.  Start taking Tylenol ES two tablets twice daily.

## 2016-06-20 ENCOUNTER — Ambulatory Visit (INDEPENDENT_AMBULATORY_CARE_PROVIDER_SITE_OTHER): Payer: BLUE CROSS/BLUE SHIELD | Admitting: Family Medicine

## 2016-06-20 ENCOUNTER — Other Ambulatory Visit: Payer: Self-pay

## 2016-06-20 ENCOUNTER — Encounter: Payer: Self-pay | Admitting: Family Medicine

## 2016-06-20 DIAGNOSIS — L02419 Cutaneous abscess of limb, unspecified: Secondary | ICD-10-CM | POA: Diagnosis not present

## 2016-06-20 DIAGNOSIS — L732 Hidradenitis suppurativa: Secondary | ICD-10-CM | POA: Diagnosis not present

## 2016-06-20 MED ORDER — DOXYCYCLINE HYCLATE 100 MG PO TABS: 100.0000 mg | ORAL_TABLET | Freq: Two times a day (BID) | ORAL | 0 refills | Status: DC

## 2016-06-20 MED ORDER — AMPHETAMINE-DEXTROAMPHET ER 20 MG PO CP24: ORAL_CAPSULE | ORAL | 0 refills | Status: DC

## 2016-06-20 NOTE — Progress Notes (Signed)
Subjective:   Patient ID: Michelle Hoffman, female    DOB: 1956-05-18, 60 y.o.   MRN: [id number]  Michelle Hoffman is a pleasant 60 y.o. year old female who presents to clinic today with Cyst (under L arm)  on 06/20/2016  HPI:  Painful knot under left arm- noticed it a few days ago.  Has had one similar under right arm that resolved.  Not red or warm.  No fevers or chills.  Warm compresses have not helped.  Current Outpatient Prescriptions on File Prior to Visit  Medication Sig Dispense Refill  . amphetamine-dextroamphetamine (ADDERALL XR) 20 MG 24 hr capsule Take one tablet by mouth daily 30 capsule 0  . calcium carbonate (TUMS) 500 MG chewable tablet Chew 1 tablet by mouth as needed for indigestion or heartburn.    . fluticasone (FLONASE) 50 MCG/ACT nasal spray instill 2 sprays into each nostril once daily 16 g 3  . ibuprofen (ADVIL,MOTRIN) 200 MG tablet Take 200 mg by mouth every 6 (six) hours as needed.     No current facility-administered medications on file prior to visit.     Allergies  Allergen Reactions  . Meloxicam     REACTION: Tongue burns  . Sulfonamide Derivatives     REACTION: Rash    Past Medical History:  Diagnosis Date  . ADHD (attention deficit hyperactivity disorder)   . Allergy   . GERD (gastroesophageal reflux disease)   . Hypertension     Past Surgical History:  Procedure Laterality Date  . ABDOMINAL HYSTERECTOMY    . CARPAL TUNNEL RELEASE    . CESAREAN SECTION    . KNEE SURGERY    . PLANTAR FASCIA SURGERY      Family History  Problem Relation Age of Onset  . Cervical cancer Mother 31  . Stroke Father     in 37  . Breast cancer Neg Hx     Social History   Social History  . Marital status: Married    Spouse name: N/A  . Number of children: 3  . Years of education: N/A   Occupational History  . custodian Information systems manager    at [employer]   Social History Main Topics  . Smoking status: Never Smoker    . Smokeless tobacco: Never Used  . Alcohol use No  . Drug use: No  . Sexual activity: Not on file   Other Topics Concern  . Not on file   Social History Narrative   Lives with husband and 3 children [phone])   The PMH, PSH, Social History, Family History, Medications, and allergies have been reviewed in [facility], and have been updated if relevant.   Review of Systems  Constitutional: Negative.   Skin:       Mass under left axilla  All other systems reviewed and are negative.      Objective:    BP 122/68   Pulse 82   Temp 97.7 F (36.5 C) (Oral)   Wt 193 lb 8 oz (87.8 kg)   SpO2 98%   BMI 37.79 kg/m    Physical Exam  Constitutional: She is oriented to person, place, and time. She appears well-developed and well-nourished. No distress.  HENT:  Head: Normocephalic.  Eyes: Conjunctivae are normal.  Cardiovascular: Normal rate.   Pulmonary/Chest: Effort normal.  Musculoskeletal: Normal range of motion.  Neurological: She is alert and oriented to person, place, and time.  Skin: She is not diaphoretic.     Psychiatric: She  has a normal mood and affect. Her behavior is normal. Judgment and thought content normal.  Nursing note and vitals reviewed.         Assessment & Plan:   Abscess, axilla  Hidradenitis No Follow-up on file.

## 2016-06-20 NOTE — Telephone Encounter (Signed)
Pt left v/m requesting rx for Adderall. Call when ready for pick up. Last printed #30 on 05/23/16 and pt last seen 11/15/15 for med refill f/u.Please advise.

## 2016-06-20 NOTE — Telephone Encounter (Signed)
Spoke to pt and informed her Rx is available for pickup from the front desk 

## 2016-06-20 NOTE — Assessment & Plan Note (Signed)
New- non fluctuant. Place on doxycyline 100 mg twice daily x 10 days, warm compresses. Discussed supportive care for hidradenitis. Call or return to clinic prn if these symptoms worsen or fail to improve as anticipated. The patient indicates understanding of these issues and agrees with the plan.

## 2016-06-20 NOTE — Patient Instructions (Signed)
Good to see you. Please take doxycycline as directed- 1 tablet twice daily x 10 days.  Please make an appointment to see Dr. Patsy Lager on your way out.

## 2016-06-20 NOTE — Progress Notes (Signed)
Pre visit review using our clinic review tool, if applicable. No additional management support is needed unless otherwise documented below in the visit note. 

## 2016-07-02 ENCOUNTER — Encounter: Payer: Self-pay | Admitting: Family Medicine

## 2016-07-02 ENCOUNTER — Ambulatory Visit (INDEPENDENT_AMBULATORY_CARE_PROVIDER_SITE_OTHER): Payer: BLUE CROSS/BLUE SHIELD | Admitting: Family Medicine

## 2016-07-02 ENCOUNTER — Ambulatory Visit: Payer: BLUE CROSS/BLUE SHIELD | Admitting: Family Medicine

## 2016-07-02 VITALS — BP 124/82 | HR 98 | Temp 98.3°F | Ht 60.0 in | Wt 191.5 lb

## 2016-07-02 DIAGNOSIS — M25561 Pain in right knee: Secondary | ICD-10-CM | POA: Diagnosis not present

## 2016-07-02 DIAGNOSIS — M7581 Other shoulder lesions, right shoulder: Secondary | ICD-10-CM | POA: Diagnosis not present

## 2016-07-02 MED ORDER — METHYLPREDNISOLONE ACETATE 40 MG/ML IJ SUSP
40.0000 mg | Freq: Once | INTRAMUSCULAR | Status: AC
  Administered 2016-07-02: 80 mg via INTRA_ARTICULAR

## 2016-07-02 NOTE — Progress Notes (Signed)
Pre visit review using our clinic review tool, if applicable. No additional management support is needed unless otherwise documented below in the visit note. 

## 2016-07-02 NOTE — Progress Notes (Signed)
Dr. Karleen Hampshire T. Janayah Zavada, MD, CAQ Sports Medicine [facility] Sports [facility] [address] [place], [address] Phone: [phone] Fax: [phone]  07/02/2016  Patient: Michelle Hoffman, MRN: [id number], DOB: 1956-08-17, 60 y.o.  Primary Physician:  Ruthe Mannan, MD   Chief Complaint  Patient presents with  . Shoulder Pain    right   . Knee Pain    right   Subjective:   Michelle Hoffman is a 60 y.o. very pleasant female patient who presents with the following:  Knee started to hurt about 2 or 2 1/2 weeks ago. No injury to the R knee. R knee is hurting in the back. When sitting up in the back, will also have some pain in the front. There is also some mild medial pain.  She has not had a significant effusion.  She has not having mechanical symptoms.  She does have some pain going up and down stairs.  No significant prior surgeries. Motrin helps a little bit.   R shoulder, has pain with abduction, and not too bad with IROM. No known specific injury.  Her strength is been preserved.   No prior surgeries or dislocations or fractures in the adjacent area.  Abscess has resolved. S/p abx  Past Medical History, Surgical History, Social History, Family History, Problem List, Medications, and Allergies have been reviewed and updated if relevant.  Patient Active Problem List   Diagnosis Date Noted  . Abscess, axilla 06/20/2016  . Hidradenitis 06/20/2016  . Arm mass 04/16/2016  . ADD (attention deficit disorder) 11/16/2014  . LATERAL EPICONDYLITIS, LEFT 07/19/2010  . Essential hypertension 11/08/2009  . GERD 11/08/2009    Past Medical History:  Diagnosis Date  . ADHD (attention deficit hyperactivity disorder)   . Allergy   . GERD (gastroesophageal reflux disease)   . Hypertension     Past Surgical History:  Procedure Laterality Date  . ABDOMINAL HYSTERECTOMY    . CARPAL TUNNEL RELEASE    . CESAREAN SECTION    . KNEE SURGERY    . PLANTAR FASCIA SURGERY        Social History   Social History  . Marital status: Married    Spouse name: N/A  . Number of children: 3  . Years of education: N/A   Occupational History  . custodian Information systems manager    at [employer]   Social History Main Topics  . Smoking status: Never Smoker  . Smokeless tobacco: Never Used  . Alcohol use No  . Drug use: No  . Sexual activity: Not on file   Other Topics Concern  . Not on file   Social History Narrative   Lives with husband and 3 children [phone])    Family History  Problem Relation Age of Onset  . Cervical cancer Mother 71  . Stroke Father     in 29  . Breast cancer Neg Hx     Allergies  Allergen Reactions  . Meloxicam     REACTION: Tongue burns  . Sulfonamide Derivatives     REACTION: Rash    Medication list reviewed and updated in full in  Link.  GEN: No fevers, chills. Nontoxic. Primarily MSK c/o today. MSK: Detailed in the HPI GI: tolerating PO intake without difficulty Neuro: No numbness, parasthesias, or tingling associated. Otherwise the pertinent positives of the ROS are noted above.   Objective:   BP 124/82   Pulse 98   Temp 98.3 F (36.8 C) (Oral)  Ht 5' (1.524 m)   Wt 191 lb 8 oz (86.9 kg)   BMI 37.40 kg/m    GEN: WDWN, NAD, Non-toxic, Alert & Oriented x 3 HEENT: Atraumatic, Normocephalic.  Ears and Nose: No external deformity. EXTR: No clubbing/cyanosis/edema NEURO: Normal gait.  PSYCH: Normally interactive. Conversant. Not depressed or anxious appearing.  Calm demeanor.   Knee:  R Gait: Normal heel toe pattern ROM: 0-120 Effusion: mild Echymosis or edema: none Patellar tendon NT Painful PLICA: neg Patellar grind: negative Medial and lateral patellar facet loading: negative medial and lateral joint lines: medial Mcmurray's pain Flexion-pinch pain Varus and valgus stress: stable Lachman: neg Ant and Post drawer: neg Hip abduction, IR, ER: WNL Hip flexion str: 5/5 Hip  abd: 5/5 Quad: 5/5 VMO atrophy:No Hamstring concentric and eccentric: 5/5   Shoulder: R Inspection: No muscle wasting or winging Ecchymosis/edema: neg  AC joint, scapula, clavicle: NT Cervical spine: NT, full ROM Spurling's: neg Abduction: full, 5/5 Flexion: full, 5/5 IR, full, lift-off: 5/5 ER at neutral: full, 5/5 AC crossover: neg Neer: neg Hawkins: mild pos Drop Test: neg Empty Can: neg Supraspinatus insertion: mild-mod T Bicipital groove: NT Speed's: neg Yergason's: neg Sulcus sign: neg Scapular dyskinesis: none C5-T1 intact  Neuro: Sensation intact Grip 5/5   Radiology: Dg Knee Ap/lat W/sunrise Right  Result Date: 06/06/2016 CLINICAL DATA:  Eight days of right knee pain with no history of trauma EXAM: RIGHT KNEE 3 VIEWS COMPARISON:  None in PACs FINDINGS: The bones are subjectively adequately mineralized. The joint spaces are preserved. There is beaking of the tibial spines. Tiny osteophytes arise from the superior and inferior articular margins of the patella. There is no acute fracture or dislocation. There is no joint effusion. IMPRESSION: Minimal osteoarthritic spurring. There is no joint space loss nor other significant bony abnormality. Electronically Signed   By: David  [place] M.D.   On: 06/06/2016 13:22    Assessment and Plan:   Knee pain, right - Plan: methylPREDNISolone acetate (DEPO-MEDROL) injection 40 mg  Right rotator cuff tendonitis  Joint spaces are relatively reserved on the right knee with minimal osteoarthritic change.  Arthritis exacerbation versus degenerative meniscal tear.  Plan to treat with conservative management.  Continued NSAIDs and ice.  Mild rotator cuff tendinopathy  From repetitive use.   Continue NSAIDs NSAIDs, range of motion, and keep her movement below the nose.  If symptoms persist, then further treatment could be done for the shoulder as well.  Knee Injection, R Patient verbally consented to procedure. Risks (including  potential rare risk of infection), benefits, and alternatives explained. Sterilely prepped with Chloraprep. Ethyl cholride used for anesthesia. 8 cc Lidocaine 1% mixed with 2 mL Depo-Medrol 40 mg injected using the anteromedial approach without difficulty. No complications with procedure and tolerated well. Patient had decreased pain post-injection.   Follow-up: prn  Signed,  Marceline Napierala T. Azlan Hanway, MD   Patient's Medications  New Prescriptions   No medications on file  Previous Medications   AMPHETAMINE-DEXTROAMPHETAMINE (ADDERALL XR) 20 MG 24 HR CAPSULE    Take one tablet by mouth daily   CALCIUM CARBONATE (TUMS) 500 MG CHEWABLE TABLET    Chew 1 tablet by mouth as needed for indigestion or heartburn.   FLUTICASONE (FLONASE) 50 MCG/ACT NASAL SPRAY    instill 2 sprays into each nostril once daily   IBUPROFEN (ADVIL,MOTRIN) 200 MG TABLET    Take 200 mg by mouth every 6 (six) hours as needed.  Modified Medications   No medications on file  Discontinued Medications   DOXYCYCLINE (VIBRA-TABS) 100 MG TABLET    Take 1 tablet (100 mg total) by mouth 2 (two) times daily.

## 2016-07-04 ENCOUNTER — Ambulatory Visit: Payer: BLUE CROSS/BLUE SHIELD | Admitting: Family Medicine

## 2016-07-17 ENCOUNTER — Other Ambulatory Visit: Payer: Self-pay

## 2016-07-17 NOTE — Telephone Encounter (Signed)
Ok to print out and put in my box for signature. 

## 2016-07-17 NOTE — Telephone Encounter (Signed)
Pt left v/m requesting rx for Adderall. Call when ready for pick up. Last printed # 30 on 06/20/16 and pt last seen 11/15/15.

## 2016-07-18 MED ORDER — AMPHETAMINE-DEXTROAMPHET ER 20 MG PO CP24: ORAL_CAPSULE | ORAL | 0 refills | Status: DC

## 2016-07-19 NOTE — Telephone Encounter (Signed)
Spoke to pt and informed her Rx is available for pickup from the front desk 

## 2016-07-25 ENCOUNTER — Encounter: Payer: Self-pay | Admitting: Family Medicine

## 2016-07-25 ENCOUNTER — Ambulatory Visit (INDEPENDENT_AMBULATORY_CARE_PROVIDER_SITE_OTHER): Payer: BLUE CROSS/BLUE SHIELD | Admitting: Family Medicine

## 2016-07-25 VITALS — BP 130/88 | HR 76 | Temp 98.2°F | Ht 60.0 in | Wt 191.5 lb

## 2016-07-25 DIAGNOSIS — M25561 Pain in right knee: Secondary | ICD-10-CM | POA: Diagnosis not present

## 2016-07-25 NOTE — Progress Notes (Signed)
Pre visit review using our clinic review tool, if applicable. No additional management support is needed unless otherwise documented below in the visit note. 

## 2016-07-25 NOTE — Progress Notes (Signed)
Dr. Karleen Hampshire T. Lavelle Berland, MD, CAQ [facility] [address] [place], [address] Phone: [phone] Fax: [phone]  07/25/2016  Patient: Michelle Hoffman, MRN: [id number], DOB: [birth year], 59 y.o.  Primary Physician:  Ruthe Mannan, MD   Chief Complaint  Patient presents with  . Knee Pain    Right   Subjective:   Michelle Hoffman is a 60 y.o. very pleasant female patient who presents with the following:  F/u > 1 month of R knee pain s/p injection x 1. Has been using NSAIDS  R knee not hurting in the back and some medial swelling 7 1/2 / 10, sometimes 10/10 pain. Like getting shot.  Feels like will be giving out.  Pain is now mostly medial.   Dr. Hadassah Pais  07/02/2016 Last OV with Hannah Beat, MD  Knee started to hurt about 2 or 2 1/2 weeks ago. No injury to the R knee. R knee is hurting in the back. When sitting up in the back, will also have some pain in the front. There is also some mild medial pain.  She has not had a significant effusion.  She has not having mechanical symptoms.  She does have some pain going up and down stairs.  No significant prior surgeries. Motrin helps a little bit.   R shoulder, has pain with abduction, and not too bad with IROM. No known specific injury.  Her strength is been preserved.   No prior surgeries or dislocations or fractures in the adjacent area.  Abscess has resolved. S/p abx  Past Medical History, Surgical History, Social History, Family History, Problem List, Medications, and Allergies have been reviewed and updated if relevant.  Patient Active Problem List   Diagnosis Date Noted  . Abscess, axilla 06/20/2016  . Hidradenitis 06/20/2016  . Arm mass 04/16/2016  . ADD (attention deficit disorder) 11/16/2014  . LATERAL EPICONDYLITIS, LEFT 07/19/2010  . Essential hypertension 11/08/2009  . GERD 11/08/2009    Past Medical History:  Diagnosis Date  . ADHD (attention deficit  hyperactivity disorder)   . Allergy   . GERD (gastroesophageal reflux disease)   . Hypertension     Past Surgical History:  Procedure Laterality Date  . ABDOMINAL HYSTERECTOMY    . CARPAL TUNNEL RELEASE    . CESAREAN SECTION    . KNEE SURGERY    . PLANTAR FASCIA SURGERY      Social History   Social History  . Marital status: Married    Spouse name: N/A  . Number of children: 3  . Years of education: N/A   Occupational History  . custodian Information systems manager    at [employer]   Social History Main Topics  . Smoking status: Never Smoker  . Smokeless tobacco: Never Used  . Alcohol use No  . Drug use: No  . Sexual activity: Not on file   Other Topics Concern  . Not on file   Social History Narrative   Lives with husband and 3 children [phone])    Family History  Problem Relation Age of Onset  . Cervical cancer Mother 72  . Stroke Father     in 81  . Breast cancer Neg Hx     Allergies  Allergen Reactions  . Meloxicam     REACTION: Tongue burns  . Sulfonamide Derivatives     REACTION: Rash    Medication list reviewed and updated in full in [facility].  GEN:  No fevers, chills. Nontoxic. Primarily MSK c/o today. MSK: Detailed in the HPI GI: tolerating PO intake without difficulty Neuro: No numbness, parasthesias, or tingling associated. Otherwise the pertinent positives of the ROS are noted above.   Objective:   BP 130/88   Pulse 76   Temp 98.2 F (36.8 C) (Oral)   Ht 5' (1.524 m)   Wt 191 lb 8 oz (86.9 kg)   BMI 37.40 kg/m    GEN: WDWN, NAD, Non-toxic, Alert & Oriented x 3 HEENT: Atraumatic, Normocephalic.  Ears and Nose: No external deformity. EXTR: No clubbing/cyanosis/edema NEURO: Normal gait.  PSYCH: Normally interactive. Conversant. Not depressed or anxious appearing.  Calm demeanor.   Knee:  R Gait: Normal heel toe pattern ROM: lack 3 deg ext -115 Effusion: mild Echymosis or edema: none Patellar tendon  NT Painful PLICA: neg Patellar grind: negative Medial and lateral patellar facet loading: negative medial and lateral joint lines: medial tenderness Bounce home test pos Mcmurray's pain Flexion-pinch pain Varus and valgus stress: stable Lachman: neg Ant and Post drawer: neg Hip abduction, IR, ER: WNL Hip flexion str: 5/5 Hip abd: 5/5 Quad: 5/5 VMO atrophy:No Hamstring concentric and eccentric: 5/5   Radiology: Dg Knee Ap/lat W/sunrise Right  Result Date: 06/06/2016 CLINICAL DATA:  Eight days of right knee pain with no history of trauma EXAM: RIGHT KNEE 3 VIEWS COMPARISON:  None in PACs FINDINGS: The bones are subjectively adequately mineralized. The joint spaces are preserved. There is beaking of the tibial spines. Tiny osteophytes arise from the superior and inferior articular margins of the patella. There is no acute fracture or dislocation. There is no joint effusion. IMPRESSION: Minimal osteoarthritic spurring. There is no joint space loss nor other significant bony abnormality. Electronically Signed   By: David  SwazilandJordan M.D.   On: 06/06/2016 13:22   Assessment and Plan:   Right knee pain - Plan: MR Knee Right Wo Contrast  5-6 weeks of R knee pain with mechanical symptoms, worsening pain, and failure to improve with NSAIDS, intraarticular steroid injection. + mcmurrays, flexion pinch, and bounce home tests. Obtain an MRI of the R knee to better characterize. Concern for meniscal tear.  Follow-up: depending on MRI  Orders Placed This Encounter  Procedures  . MR Knee Right Wo Contrast    Signed,  Cleveland Paiz T. Olanda Boughner, MD   Patient's Medications  New Prescriptions   No medications on file  Previous Medications   AMPHETAMINE-DEXTROAMPHETAMINE (ADDERALL XR) 20 MG 24 HR CAPSULE    Take one tablet by mouth daily   CALCIUM CARBONATE (TUMS) 500 MG CHEWABLE TABLET    Chew 1 tablet by mouth as needed for indigestion or heartburn.   FLUTICASONE (FLONASE) 50 MCG/ACT NASAL SPRAY     instill 2 sprays into each nostril once daily   IBUPROFEN (ADVIL,MOTRIN) 200 MG TABLET    Take 200 mg by mouth every 6 (six) hours as needed.  Modified Medications   No medications on file  Discontinued Medications   No medications on file

## 2016-07-25 NOTE — Patient Instructions (Signed)

## 2016-08-02 ENCOUNTER — Ambulatory Visit
Admission: RE | Admit: 2016-08-02 | Discharge: 2016-08-02 | Disposition: A | Discharge status: Home / Self Care | Payer: BLUE CROSS/BLUE SHIELD | Source: Ambulatory Visit | Attending: Family Medicine | Admitting: Family Medicine

## 2016-08-02 ENCOUNTER — Other Ambulatory Visit: Payer: Self-pay | Admitting: Family Medicine

## 2016-08-02 DIAGNOSIS — S82291A Other fracture of shaft of right tibia, initial encounter for closed fracture: Secondary | ICD-10-CM | POA: Insufficient documentation

## 2016-08-02 DIAGNOSIS — S83241A Other tear of medial meniscus, current injury, right knee, initial encounter: Secondary | ICD-10-CM | POA: Diagnosis not present

## 2016-08-02 DIAGNOSIS — X58XXXA Exposure to other specified factors, initial encounter: Secondary | ICD-10-CM | POA: Insufficient documentation

## 2016-08-02 DIAGNOSIS — S82131A Displaced fracture of medial condyle of right tibia, initial encounter for closed fracture: Secondary | ICD-10-CM

## 2016-08-02 DIAGNOSIS — M25561 Pain in right knee: Secondary | ICD-10-CM | POA: Insufficient documentation

## 2016-08-03 ENCOUNTER — Telehealth: Payer: Self-pay | Admitting: Family Medicine

## 2016-08-03 NOTE — Telephone Encounter (Signed)
ok 

## 2016-08-03 NOTE — Telephone Encounter (Signed)
Pt called to let us know that she has appt with murphy wainer next Wednesday . No cb needed

## 2016-08-17 ENCOUNTER — Other Ambulatory Visit: Payer: Self-pay

## 2016-08-17 MED ORDER — AMPHETAMINE-DEXTROAMPHET ER 20 MG PO CP24: ORAL_CAPSULE | ORAL | 0 refills | Status: DC

## 2016-08-17 NOTE — Telephone Encounter (Signed)
Ok to print and put on my desk fr signature.

## 2016-08-17 NOTE — Telephone Encounter (Signed)
Pt left v/m requesting rx for Adderall. Call when ready for pick up. Last printed # 30 on 07/18/16; last seen f/u 11/15/15.Please advise.

## 2016-08-22 NOTE — Telephone Encounter (Signed)
Lm on pts vm and advised Rx is available for pickup from the front desk 

## 2016-08-27 ENCOUNTER — Telehealth: Payer: Self-pay | Admitting: Family Medicine

## 2016-08-27 NOTE — Telephone Encounter (Signed)
Spoke to pt and advised her that according to her chart, she never completed the titers; orders were cancelled.

## 2016-08-27 NOTE — Telephone Encounter (Signed)
Patient called and asked for a copy of her titer for her immunizations.  Patient is coming by to pick up a rx and wants to know if it can be ready today.  Please call patient when it's ready.

## 2016-10-02 ENCOUNTER — Other Ambulatory Visit: Payer: Self-pay

## 2016-10-02 MED ORDER — AMPHETAMINE-DEXTROAMPHET ER 20 MG PO CP24: ORAL_CAPSULE | ORAL | 0 refills | Status: DC

## 2016-10-02 NOTE — Telephone Encounter (Signed)
Pt left v/m requesting rx for Adderall. Call when ready for pick up. rx last printed # 30 on 08/17/16; pt last seen 11/15/15 for ADD; other acute visits since seen 11/15/15.pt said pts daughter or sister in law will pick up rx.

## 2016-10-03 ENCOUNTER — Encounter: Payer: Self-pay | Admitting: Family Medicine

## 2016-10-03 MED ORDER — AMPHETAMINE-DEXTROAMPHET ER 20 MG PO CP24: ORAL_CAPSULE | ORAL | 0 refills | Status: DC

## 2016-10-03 NOTE — Addendum Note (Signed)
Addended by: Desmond DikeKNIGHT, Nester Bachus H on: 10/03/2016 09:50 AM   Modules accepted: Orders

## 2016-10-03 NOTE — Telephone Encounter (Signed)
Lm on pts vm and informed her Rx is available for pickup from the front desk. Pt also advised that, unfortunately, she is unable to have third party pickup Rx

## 2016-10-11 ENCOUNTER — Ambulatory Visit: Payer: BLUE CROSS/BLUE SHIELD | Admitting: Family Medicine

## 2016-10-30 ENCOUNTER — Other Ambulatory Visit: Payer: Self-pay

## 2016-10-30 MED ORDER — AMPHETAMINE-DEXTROAMPHET ER 20 MG PO CP24: ORAL_CAPSULE | ORAL | 0 refills | Status: DC

## 2016-10-30 NOTE — Telephone Encounter (Signed)
Pt left v/m requesting rx for Adderall. Call when ready for pick up. Pt last seen for F/U for ADD on 11/15/15. Last printed # 30 on 10/03/16. No future appt scheduled. Pt is working at [employer] now and has new ins. Pt is not sure if PA will be needed.

## 2016-10-31 NOTE — Telephone Encounter (Signed)
Lm on pts vm and informed her Rx is available for pickup from the front desk 

## 2016-12-03 ENCOUNTER — Other Ambulatory Visit: Payer: Self-pay

## 2016-12-03 NOTE — Telephone Encounter (Signed)
Pt left v/m requesting rx for Adderall. Call when ready for pick up. Last printed # 30 on 10/30/16; pt last seen for ADD 11/15/15; pt has had multiple acute visits 2017. No future appt.

## 2016-12-04 MED ORDER — AMPHETAMINE-DEXTROAMPHET ER 20 MG PO CP24: ORAL_CAPSULE | ORAL | 0 refills | Status: DC

## 2016-12-04 NOTE — Telephone Encounter (Signed)
Lm on pts vm and advised her Rx is available for pickup from the front desk

## 2016-12-17 ENCOUNTER — Emergency Department
Admission: EM | Admit: 2016-12-17 | Discharge: 2016-12-17 | Disposition: A | Discharge status: Home / Self Care | Payer: Managed Care, Other (non HMO) | Attending: Emergency Medicine | Admitting: Emergency Medicine

## 2016-12-17 ENCOUNTER — Encounter: Payer: Self-pay | Admitting: Emergency Medicine

## 2016-12-17 ENCOUNTER — Emergency Department: Payer: Managed Care, Other (non HMO)

## 2016-12-17 DIAGNOSIS — M791 Myalgia: Secondary | ICD-10-CM | POA: Insufficient documentation

## 2016-12-17 DIAGNOSIS — Y92488 Other paved roadways as the place of occurrence of the external cause: Secondary | ICD-10-CM | POA: Diagnosis not present

## 2016-12-17 DIAGNOSIS — Z79899 Other long term (current) drug therapy: Secondary | ICD-10-CM | POA: Insufficient documentation

## 2016-12-17 DIAGNOSIS — Y999 Unspecified external cause status: Secondary | ICD-10-CM | POA: Insufficient documentation

## 2016-12-17 DIAGNOSIS — I10 Essential (primary) hypertension: Secondary | ICD-10-CM | POA: Diagnosis not present

## 2016-12-17 DIAGNOSIS — M7918 Myalgia, other site: Secondary | ICD-10-CM

## 2016-12-17 DIAGNOSIS — S4991XA Unspecified injury of right shoulder and upper arm, initial encounter: Secondary | ICD-10-CM | POA: Diagnosis present

## 2016-12-17 DIAGNOSIS — F909 Attention-deficit hyperactivity disorder, unspecified type: Secondary | ICD-10-CM | POA: Diagnosis not present

## 2016-12-17 DIAGNOSIS — Y9389 Activity, other specified: Secondary | ICD-10-CM | POA: Insufficient documentation

## 2016-12-17 MED ORDER — TRAMADOL HCL 50 MG PO TABS: 50.0000 mg | ORAL_TABLET | Freq: Four times a day (QID) | ORAL | 0 refills | Status: AC | PRN

## 2016-12-17 MED ORDER — CYCLOBENZAPRINE HCL 10 MG PO TABS: 10.0000 mg | ORAL_TABLET | Freq: Three times a day (TID) | ORAL | 0 refills | Status: AC | PRN
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
## 2017-01-02 IMAGING — CT CT ANGIO CHEST
2 of 6 series · 18 of 36 positions shown · IV contrast (APPLIED)
Comparison: Chest radiograph performed earlier today at [DATE] a.m.,
and CTA of the chest performed 11/01/2012

CLINICAL DATA: Acute onset of left-sided chest pain, radiating to
the back. Difficulty breathing. Initial encounter.

EXAM:
CT ANGIOGRAPHY CHEST WITH CONTRAST
TECHNIQUE: Multidetector CT imaging of the chest was performed using the
standard protocol during bolus administration of intravenous
contrast. Multiplanar CT image reconstructions and MIPs were
obtained to evaluate the vascular anatomy.
CONTRAST:  100mL OMNIPAQUE IOHEXOL 350 MG/ML SOLN

[Series 6: arterial · axial · arterial · 0.74mm/px · z∈[-290,-60]mm · 17 of 129 slices shown]
[im 7/129  lung]
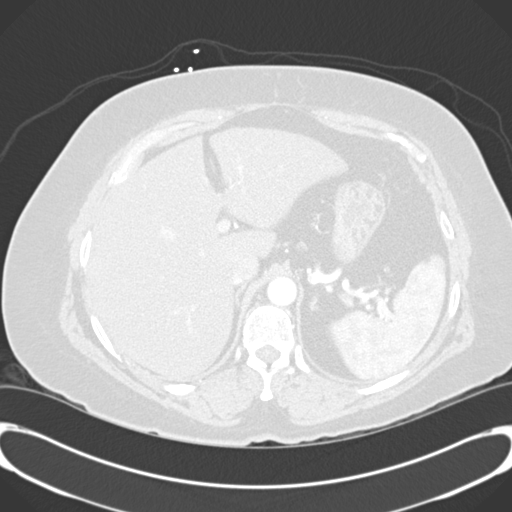
[im 14/129  mediastinal]
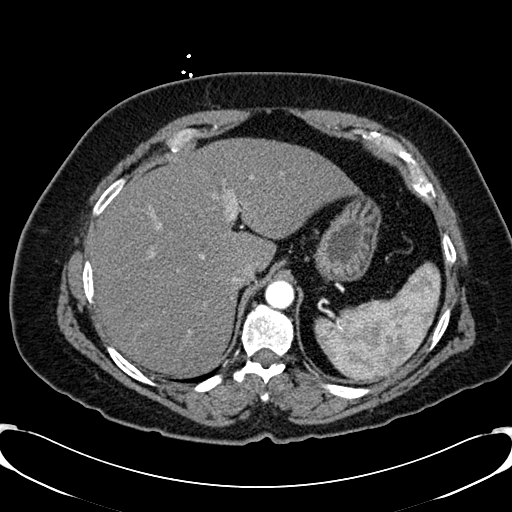
[im 21/129  lung]
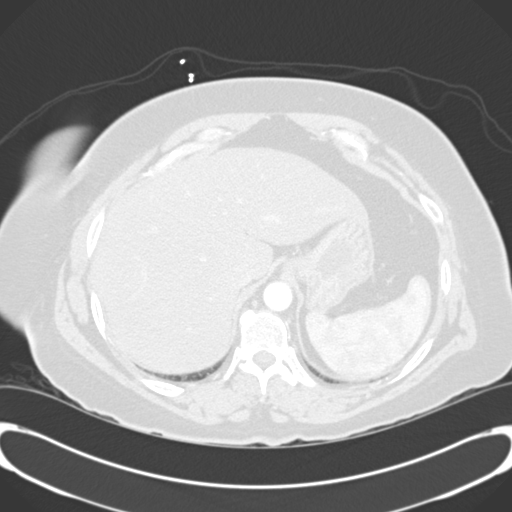
[im 27/129  mediastinal]
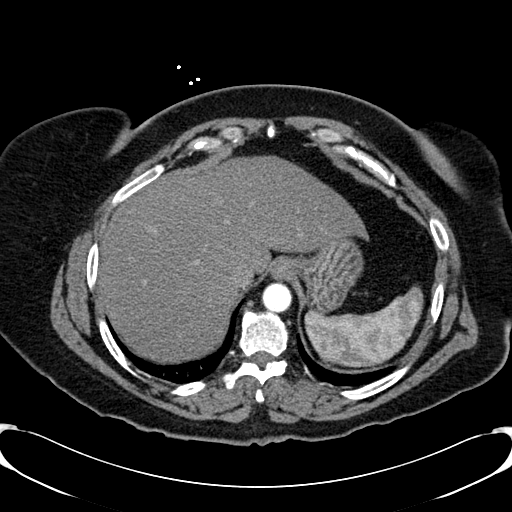
[im 34/129  lung]
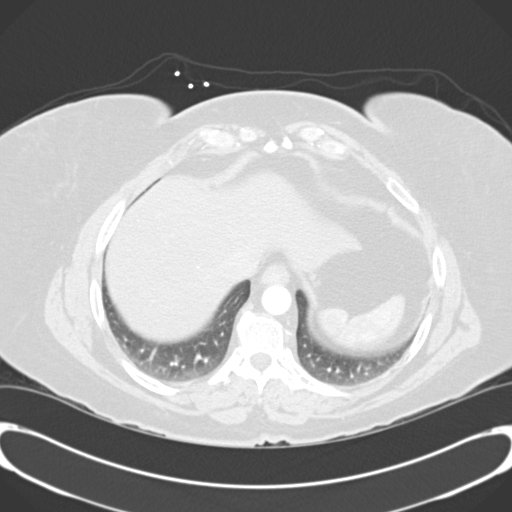
[im 41/129  mediastinal]
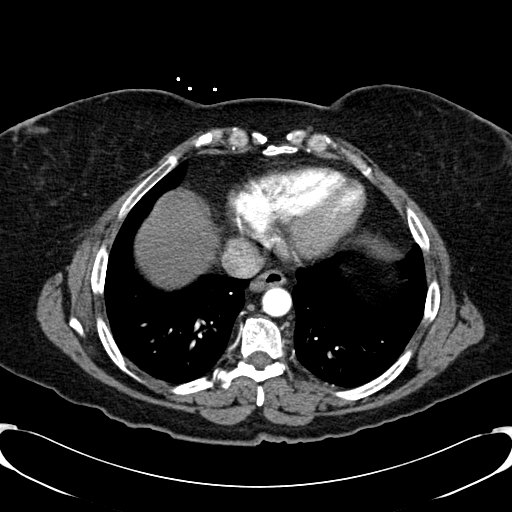
[im 48/129  lung]
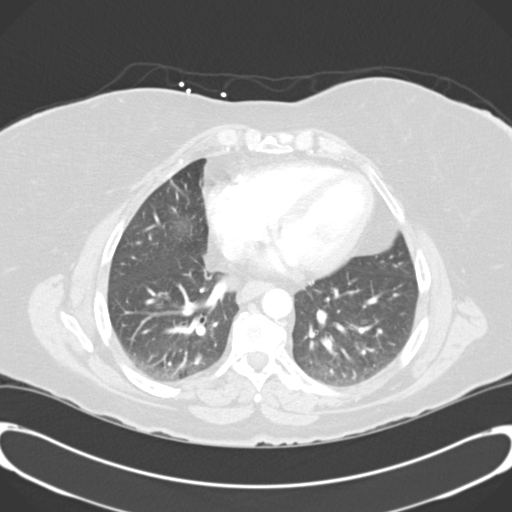
[im 54/129  mediastinal]
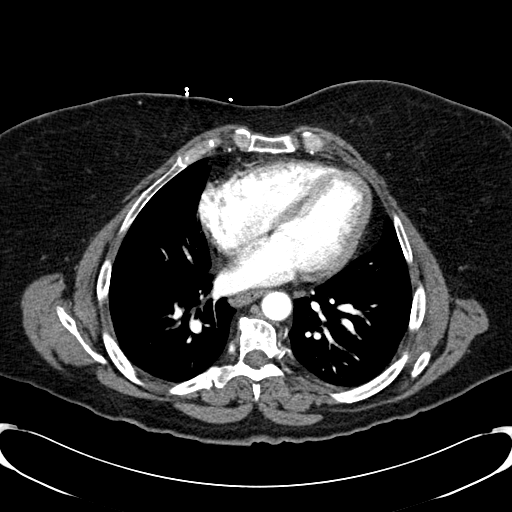
[im 68/129  lung]
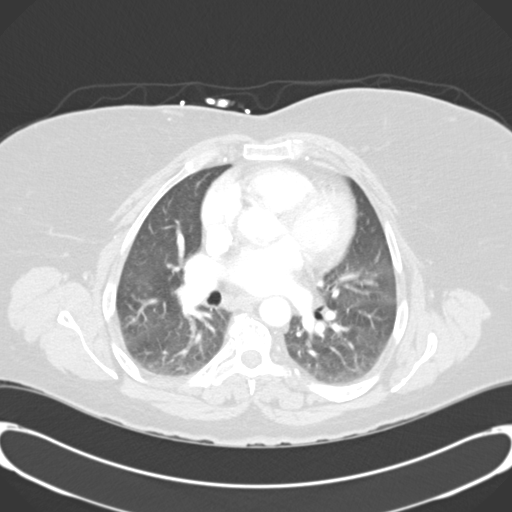
[im 75/129  mediastinal]
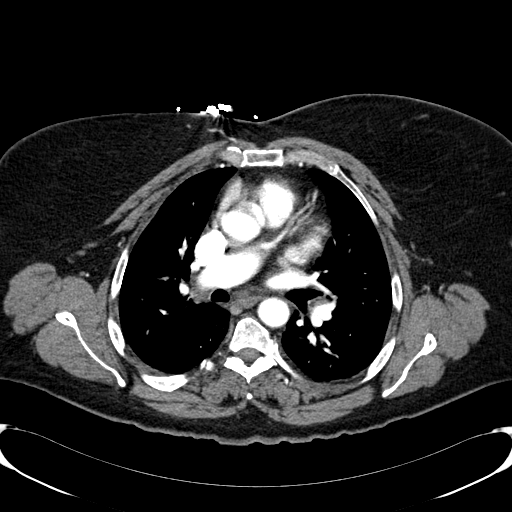
[im 81/129  lung]
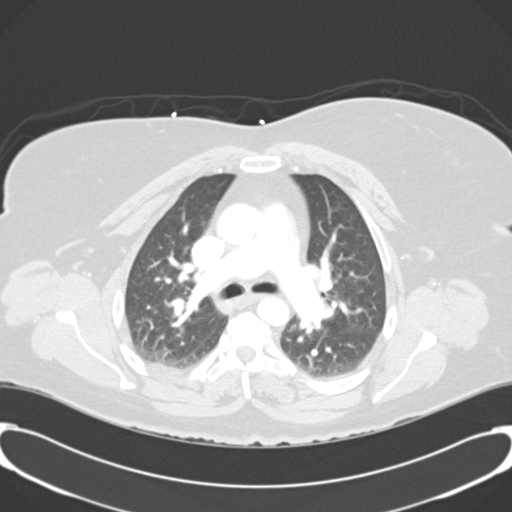
[im 88/129  mediastinal]
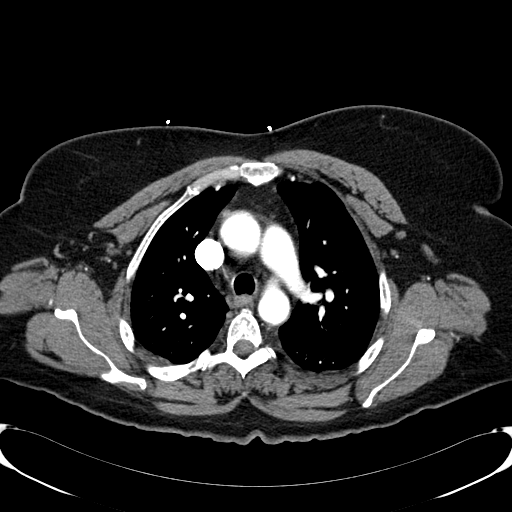
[im 95/129  lung]
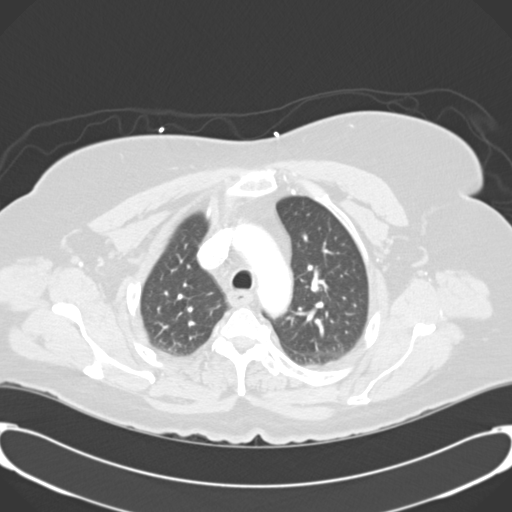
[im 102/129  mediastinal]
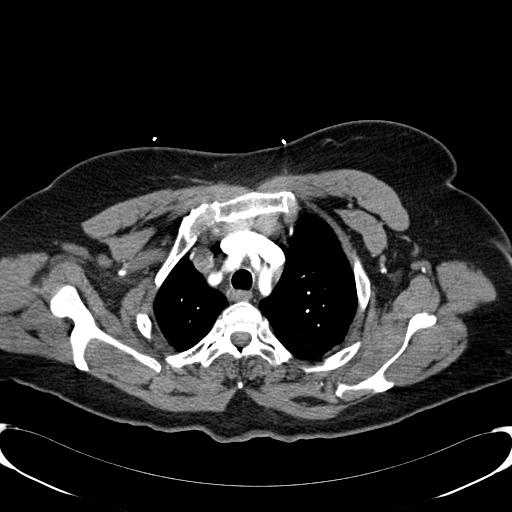
[im 108/129  lung]
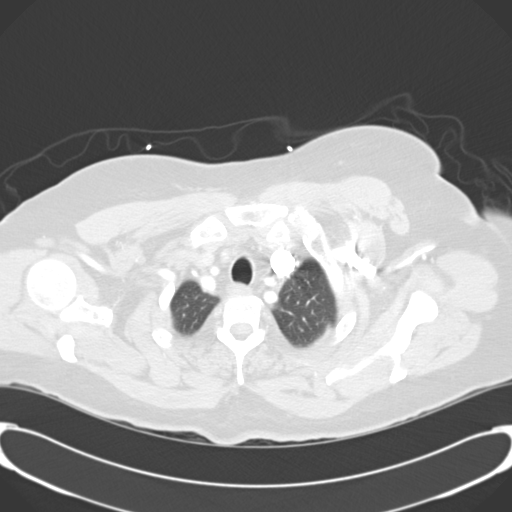
[im 115/129  mediastinal]
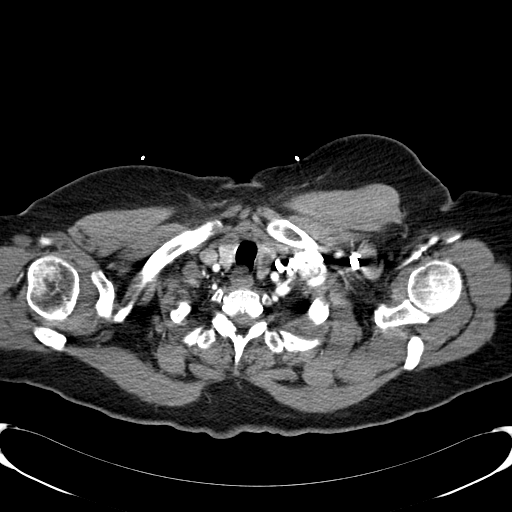
[im 122/129  lung]
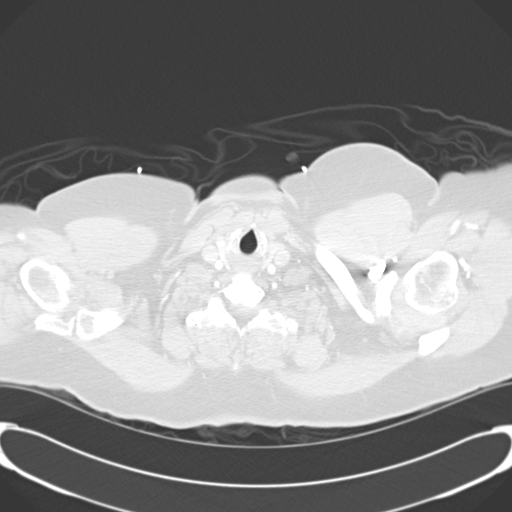

[Series 8: cor arterial mpr · coronal · arterial · 0.49mm/px · 1 of 125 slices shown]
[im 63/125  mediastinal]
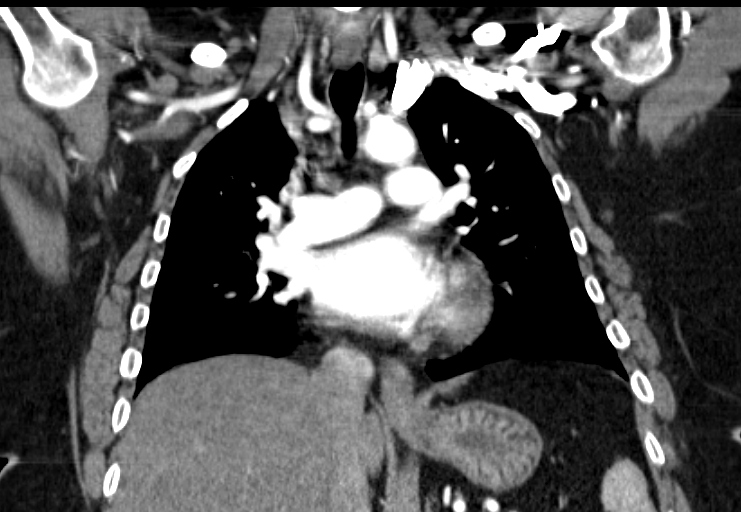

[18 of 36 positions shown; findings below may reference images not displayed]

FINDINGS: There is no evidence of significant pulmonary embolus.

There is no evidence of aortic dissection. There is no evidence of
aneurysmal dilatation. Minimal calcific atherosclerotic disease is
noted along the aortic arch.

The lungs are essentially clear bilaterally. There is no evidence of
significant focal consolidation, pleural effusion or pneumothorax.
No masses are identified; no abnormal focal contrast enhancement is
seen.

The mediastinum is unremarkable in appearance. No mediastinal
lymphadenopathy is seen. No pericardial effusion is identified. The
great vessels are grossly unremarkable in appearance. No axillary
lymphadenopathy is seen. The thyroid gland is unremarkable in
appearance.

The visualized portions of the liver and spleen are unremarkable.

No acute osseous abnormalities are seen. Minimal degenerative change
is noted at the lower cervical spine.

Review of the MIP images confirms the above findings.
IMPRESSION: 1. No evidence of significant pulmonary embolus.
2. No evidence of aortic dissection.
3. Lungs clear bilaterally.

## 2017-01-04 ENCOUNTER — Telehealth: Payer: Self-pay

## 2017-01-04 NOTE — Telephone Encounter (Signed)
Pt left v/m requesting rx for Adderall. Call when ready for pick up. Pt has enough med to last until 01/07/17. Last printed # 30 on 12/04/16; last seen 11/15/15 but had several acute visits since then.Please advise.

## 2017-01-04 NOTE — Telephone Encounter (Signed)
Ok to print out and leave on my desk for signature. 

## 2017-01-07 ENCOUNTER — Other Ambulatory Visit: Payer: Self-pay | Admitting: Family Medicine

## 2017-01-07 MED ORDER — AMPHETAMINE-DEXTROAMPHET ER 20 MG PO CP24: ORAL_CAPSULE | ORAL | 0 refills | Status: DC

## 2017-01-07 NOTE — Telephone Encounter (Signed)
LM on pts vm and informed her Rx is available for pickup from the front desk

## 2017-01-07 NOTE — Telephone Encounter (Signed)
Pt left v/m requesting cb on picking up adderall rx. Pt is out of med for tomorrow.Please advise.

## 2017-02-05 ENCOUNTER — Other Ambulatory Visit: Payer: Self-pay | Admitting: Family Medicine

## 2017-02-05 MED ORDER — AMPHETAMINE-DEXTROAMPHET ER 20 MG PO CP24: ORAL_CAPSULE | ORAL | 0 refills | Status: DC

## 2017-02-05 NOTE — Telephone Encounter (Signed)
Last printed 01-07-17 #30 Multiple OVs the last year for knee pain along with multiple cancellations. No Future OV

## 2017-02-07 ENCOUNTER — Encounter: Payer: Self-pay | Admitting: Family Medicine

## 2017-02-07 DIAGNOSIS — H9190 Unspecified hearing loss, unspecified ear: Secondary | ICD-10-CM

## 2017-02-11 NOTE — Telephone Encounter (Signed)
Need referral

## 2017-02-14 ENCOUNTER — Encounter: Payer: Self-pay | Admitting: Family Medicine

## 2017-02-14 ENCOUNTER — Other Ambulatory Visit: Payer: Self-pay | Admitting: Family Medicine

## 2017-02-14 MED ORDER — SCOPOLAMINE 1 MG/3DAYS TD PT72: 1.0000 | MEDICATED_PATCH | TRANSDERMAL | 12 refills | Status: AC

## 2017-02-26 ENCOUNTER — Encounter: Payer: Self-pay | Admitting: Family Medicine

## 2017-03-08 ENCOUNTER — Other Ambulatory Visit: Payer: Self-pay | Admitting: Family Medicine

## 2017-03-08 MED ORDER — AMPHETAMINE-DEXTROAMPHET ER 20 MG PO CP24: ORAL_CAPSULE | ORAL | 0 refills | Status: DC

## 2017-03-08 NOTE — Telephone Encounter (Signed)
Printed and in Kim's box 

## 2017-03-08 NOTE — Telephone Encounter (Signed)
Dr Dayton Martes pt--Last refill 02/05/17--please advise

## 2017-03-11 NOTE — Telephone Encounter (Signed)
Message left advising patient and Rx placed up front for pick up. 

## 2017-04-05 ENCOUNTER — Other Ambulatory Visit: Payer: Self-pay | Admitting: Family Medicine

## 2017-04-05 ENCOUNTER — Encounter: Payer: Self-pay | Admitting: Family Medicine

## 2017-04-10 ENCOUNTER — Encounter: Payer: Self-pay | Admitting: Family Medicine

## 2017-04-10 ENCOUNTER — Other Ambulatory Visit: Payer: Self-pay

## 2017-04-10 MED ORDER — AMPHETAMINE-DEXTROAMPHET ER 20 MG PO CP24: ORAL_CAPSULE | ORAL | 0 refills | Status: AC

## 2017-04-12 ENCOUNTER — Encounter: Payer: Self-pay | Admitting: Family Medicine

## 2017-09-23 ENCOUNTER — Emergency Department
Admission: EM | Admit: 2017-09-23 | Discharge: 2017-09-23 | Disposition: A | Discharge status: Home / Self Care | Payer: Managed Care, Other (non HMO) | Attending: Emergency Medicine | Admitting: Emergency Medicine

## 2017-09-23 DIAGNOSIS — L03115 Cellulitis of right lower limb: Secondary | ICD-10-CM | POA: Diagnosis present

## 2017-09-23 DIAGNOSIS — Z79899 Other long term (current) drug therapy: Secondary | ICD-10-CM | POA: Diagnosis not present

## 2017-09-23 DIAGNOSIS — W57XXXA Bitten or stung by nonvenomous insect and other nonvenomous arthropods, initial encounter: Secondary | ICD-10-CM | POA: Diagnosis not present

## 2017-09-23 DIAGNOSIS — I10 Essential (primary) hypertension: Secondary | ICD-10-CM | POA: Insufficient documentation

## 2017-09-23 MED ORDER — FLUCONAZOLE 150 MG PO TABS: 150.0000 mg | ORAL_TABLET | Freq: Once | ORAL | 0 refills | Status: AC

## 2017-09-23 MED ORDER — CLINDAMYCIN HCL 300 MG PO CAPS: 300.0000 mg | ORAL_CAPSULE | Freq: Four times a day (QID) | ORAL | 0 refills | Status: AC

## 2017-09-23 NOTE — ED Provider Notes (Signed)
[facility] Emergency Department Provider Note  ____________________________________________  Time seen: Approximately 7:49 PM  I have reviewed the triage vital signs and the nursing notes.   HISTORY  Chief Complaint Cellulitis and Insect Bite    HPI Michelle Hoffman is a 61 y.o. female who presents the emergency department complaining of a erythematous and draining lesion to the right hip.  Area has been growing over the past week.  It is draining scant amount of purulent drainage.  Area is very tender to palpation.  No systemic complaints of fevers and chills, nausea vomiting, abdominal pain.  Patient has been using Neosporin and keeping area covered.  No other complaints at this time.  No other medications.  Past Medical History:  Diagnosis Date  . ADHD (attention deficit hyperactivity disorder)   . Allergy   . GERD (gastroesophageal reflux disease)   . Hypertension     Patient Active Problem List   Diagnosis Date Noted  . Abscess, axilla 06/20/2016  . Hidradenitis 06/20/2016  . Arm mass 04/16/2016  . ADD (attention deficit disorder) 11/16/2014  . LATERAL EPICONDYLITIS, LEFT 07/19/2010  . Essential hypertension 11/08/2009  . GERD 11/08/2009    Past Surgical History:  Procedure Laterality Date  . ABDOMINAL HYSTERECTOMY    . CARPAL TUNNEL RELEASE    . CESAREAN SECTION    . KNEE SURGERY    . PLANTAR FASCIA SURGERY      Prior to Admission medications   Medication Sig Start Date End Date Taking? Authorizing Provider  amphetamine-dextroamphetamine (ADDERALL XR) 20 MG 24 hr capsule Take one tablet by mouth daily 04/10/17   Lorre Munroe, NP  calcium carbonate (TUMS) 500 MG chewable tablet Chew 1 tablet by mouth as needed for indigestion or heartburn.    [provider]  clindamycin (CLEOCIN) 300 MG capsule Take 1 capsule (300 mg total) by mouth 4 (four) times daily. 09/23/17   Yoselin Amerman, Delorise Royals, PA-C  cyclobenzaprine (FLEXERIL)  10 MG tablet Take 1 tablet (10 mg total) by mouth 3 (three) times daily as needed. 12/17/16   Joni Reining, PA-C  fluconazole (DIFLUCAN) 150 MG tablet Take 1 tablet (150 mg total) by mouth once. Take after finishing antibiotics if having symptoms 09/23/17 09/23/17  Brynlynn Walko, Delorise Royals, PA-C  fluticasone [facility]) 50 MCG/ACT nasal spray instill 2 sprays into each nostril once daily 08/29/15   Dianne Dun, MD  ibuprofen (ADVIL,MOTRIN) 200 MG tablet Take 200 mg by mouth every 6 (six) hours as needed.    [provider]  scopolamine (TRANSDERM-SCOP, 1.5 MG,) 1 MG/3DAYS Place 1 patch (1.5 mg total) onto the skin every 3 (three) days. 02/14/17   Dianne Dun, MD  traMADol (ULTRAM) 50 MG tablet Take 1 tablet (50 mg total) by mouth every 6 (six) hours as needed for moderate pain. 12/17/16   Joni Reining, PA-C    Allergies Meloxicam and Sulfonamide derivatives  Family History  Problem Relation Age of Onset  . Cervical cancer Mother 12  . Stroke Father        in 22  . Breast cancer Neg Hx     Social History Social History  Substance Use Topics  . Smoking status: Never Smoker  . Smokeless tobacco: Never Used  . Alcohol use No     Review of Systems  Constitutional: No fever/chills Eyes: No visual changes.  Cardiovascular: no chest pain. Respiratory: no cough. No SOB. Gastrointestinal: No abdominal pain.  No nausea, no vomiting.  Musculoskeletal:  Negative for musculoskeletal pain. Skin: Positive for draining lesion to the right hip Neurological: Negative for headaches, focal weakness or numbness. 10-point ROS otherwise negative.  ____________________________________________   PHYSICAL EXAM:  VITAL SIGNS: ED Triage Vitals  Enc Vitals Group     BP 09/23/17 1909 113/63     Pulse Rate 09/23/17 1909 80     Resp 09/23/17 1909 17     Temp 09/23/17 1909 98 F (36.7 C)     Temp Source 09/23/17 1909 Oral     SpO2 09/23/17 1909 99 %     Weight 09/23/17 1908 196 lb  (88.9 kg)     Height 09/23/17 1908 5' (1.524 m)     Head Circumference --      Peak Flow --      Pain Score 09/23/17 1908 5     Pain Loc --      Pain Edu? --      Excl. in GC? --      Constitutional: Alert and oriented. Well appearing and in no acute distress. Eyes: Conjunctivae are normal. PERRL. EOMI. Head: Atraumatic. Neck: No stridor.    Cardiovascular: Normal rate, regular rhythm. Normal S1 and S2.  Good peripheral circulation. Respiratory: Normal respiratory effort without tachypnea or retractions. Lungs CTAB. Good air entry to the bases with no decreased or absent breath sounds. Musculoskeletal: Full range of motion to all extremities. No gross deformities appreciated. Neurologic:  Normal speech and language. No gross focal neurologic deficits are appreciated.  Skin:  Skin is warm, dry and intact. No rash noted.  Erythematous and edematous lesion noted to the right.  Measures approximately 6 cm in diameter.  Central area is unroofed with mild pustular drainage.  No fluctuance or induration.  Area is tender to palpation. Psychiatric: Mood and affect are normal. Speech and behavior are normal. Patient exhibits appropriate insight and judgement.   ____________________________________________   LABS (all labs ordered are listed, but only abnormal results are displayed)  Labs Reviewed - No data to display ____________________________________________  EKG   ____________________________________________  RADIOLOGY   No results found.  ____________________________________________    PROCEDURES  Procedure(s) performed:    Procedures    Medications - No data to display   ____________________________________________   INITIAL IMPRESSION / ASSESSMENT AND PLAN / ED COURSE  Pertinent labs & imaging results that were available during my care of the patient were reviewed by me and considered in my medical decision making (see chart for details).  Review of the [place]  CSRS was performed in accordance of the [facility] prior to dispensing any controlled drugs.     Patient's diagnosis is consistent with cellulitis to the right hip.  Differential included abscess versus cellulitis.  Area is open, draining scant amount of purulent discharge.  No indication for incision and drainage at this time.  Patient is allergic to Bactrim and as such will be placed on clindamycin.. Patient will be discharged home with prescriptions for clindamycin as well as Diflucan should patient experience her typical complaint of yeast infection after finishing antibiotics.  She is advised to use probiotics to prevent C. difficile as well as vaginal candidiasis.. Patient is to follow up with primary care as needed or otherwise directed. Patient is given ED precautions to return to the ED for any worsening or new symptoms.     ____________________________________________  FINAL CLINICAL IMPRESSION(S) / ED DIAGNOSES  Final diagnoses:  Cellulitis of right lower extremity      NEW MEDICATIONS STARTED DURING  THIS VISIT:  New Prescriptions   CLINDAMYCIN (CLEOCIN) 300 MG CAPSULE    Take 1 capsule (300 mg total) by mouth 4 (four) times daily.   FLUCONAZOLE (DIFLUCAN) 150 MG TABLET    Take 1 tablet (150 mg total) by mouth once. Take after finishing antibiotics if having symptoms        This chart was dictated using voice recognition software/Dragon. Despite best efforts to proofread, errors can occur which can change the meaning. Any change was purely unintentional.    Racheal PatchesCuthriell, Khristie Sak D, PA-C 09/23/17 2029    Sharman CheekStafford, Phillip, MD 09/23/17 2342

## 2017-09-23 NOTE — ED Notes (Signed)

## 2017-09-23 NOTE — ED Triage Notes (Signed)
Pt arrives to ED via POV with c/o pain/redness/swelling on the RIGHT hip d/t a possible insect bite. Pt reports s/x's x1 week; with yellow/red/clear drainage from the site.

## 2017-09-23 NOTE — ED Notes (Signed)
Pt reports "feeling a tender spot" one week ago on right lower abd/flank area. Pt states initially it was "just a red spot then 2 days later had a head on it with drainage." Pt reports since then it has continued to have drainage, currently drainage appears green. Redness noted to site as well. Pt denies fever. Pt denies itchiness when initially seeing the spot. Pt states she was seen by PCP who told her to clean site, apply neosporin and cover with band aid which pt states she has been doing.

## 2018-01-16 ENCOUNTER — Telehealth: Payer: Self-pay

## 2018-01-16 NOTE — Telephone Encounter (Signed)
error
